# Patient Record
Sex: Male | Born: 1965 | Race: White | Hispanic: No | Marital: Single | Smoking: Current every day smoker
Health system: Southern US, Community
[De-identification: ages and names within clinical notes are randomized; demographics above are authoritative.]

## PROBLEM LIST (undated history)

## (undated) DIAGNOSIS — I10 Essential (primary) hypertension: Secondary | ICD-10-CM

## (undated) DIAGNOSIS — E785 Hyperlipidemia, unspecified: Secondary | ICD-10-CM

## (undated) DIAGNOSIS — L0291 Cutaneous abscess, unspecified: Secondary | ICD-10-CM

## (undated) DIAGNOSIS — M1990 Unspecified osteoarthritis, unspecified site: Secondary | ICD-10-CM

## (undated) HISTORY — PX: CHOLECYSTECTOMY: SHX55

## (undated) HISTORY — PX: APPENDECTOMY: SHX54

## (undated) HISTORY — PX: APPENDECTOMY (OPEN): SHX54

## (undated) HISTORY — PX: OTHER SURGICAL HISTORY: SHX169

## (undated) HISTORY — PX: ABDOMINAL SURGERY: SHX537

---

## 1992-02-29 ENCOUNTER — Emergency Department: Admit: 1992-02-29 | Disposition: A | Discharge status: Home / Self Care | Payer: Self-pay | Source: Ambulatory Visit

## 1998-11-12 ENCOUNTER — Emergency Department: Admit: 1998-11-12 | Disposition: A | Discharge status: Home / Self Care | Payer: Self-pay | Source: Ambulatory Visit

## 2002-05-17 ENCOUNTER — Emergency Department: Admit: 2002-05-17 | Payer: Self-pay | Source: Emergency Department | Admitting: Emergency Medicine

## 2004-10-22 ENCOUNTER — Emergency Department
Admission: EM | Admit: 2004-10-22 | Disposition: A | Discharge status: Home / Self Care | Payer: Self-pay | Source: Ambulatory Visit

## 2004-10-23 ENCOUNTER — Ambulatory Visit
Admission: AD | Admit: 2004-10-23 | Disposition: A | Discharge status: Home / Self Care | Payer: Self-pay | Source: Ambulatory Visit

## 2004-10-30 ENCOUNTER — Ambulatory Visit: Admit: 2004-10-30 | Disposition: A | Discharge status: Home / Self Care | Payer: Self-pay | Source: Ambulatory Visit

## 2004-12-10 ENCOUNTER — Ambulatory Visit
Admission: RE | Admit: 2004-12-10 | Disposition: A | Discharge status: Home / Self Care | Payer: Self-pay | Source: Ambulatory Visit

## 2005-03-16 ENCOUNTER — Observation Stay
Admission: EM | Admit: 2005-03-16 | Disposition: A | Discharge status: Home / Self Care | Payer: Self-pay | Source: Ambulatory Visit

## 2005-03-16 ENCOUNTER — Ambulatory Visit
Admission: AD | Admit: 2005-03-16 | Disposition: A | Discharge status: Home / Self Care | Payer: Self-pay | Source: Ambulatory Visit

## 2013-07-18 ENCOUNTER — Inpatient Hospital Stay
Admission: EM | Admit: 2013-07-18 | Discharge: 2013-07-20 | DRG: 287 | Disposition: A | Discharge status: Home / Self Care | Payer: Self-pay | Attending: Internal Medicine | Admitting: Internal Medicine

## 2013-07-18 ENCOUNTER — Inpatient Hospital Stay: Payer: Self-pay | Admitting: Internal Medicine

## 2013-07-18 ENCOUNTER — Emergency Department: Payer: Self-pay

## 2013-07-18 DIAGNOSIS — I2510 Atherosclerotic heart disease of native coronary artery without angina pectoris: Secondary | ICD-10-CM | POA: Diagnosis present

## 2013-07-18 DIAGNOSIS — R9439 Abnormal result of other cardiovascular function study: Secondary | ICD-10-CM | POA: Diagnosis present

## 2013-07-18 DIAGNOSIS — M7730 Calcaneal spur, unspecified foot: Secondary | ICD-10-CM | POA: Diagnosis present

## 2013-07-18 DIAGNOSIS — R0789 Other chest pain: Principal | ICD-10-CM | POA: Diagnosis present

## 2013-07-18 DIAGNOSIS — F411 Generalized anxiety disorder: Secondary | ICD-10-CM | POA: Diagnosis present

## 2013-07-18 DIAGNOSIS — Z8249 Family history of ischemic heart disease and other diseases of the circulatory system: Secondary | ICD-10-CM

## 2013-07-18 DIAGNOSIS — Z833 Family history of diabetes mellitus: Secondary | ICD-10-CM

## 2013-07-18 DIAGNOSIS — Z7982 Long term (current) use of aspirin: Secondary | ICD-10-CM

## 2013-07-18 DIAGNOSIS — E785 Hyperlipidemia, unspecified: Secondary | ICD-10-CM | POA: Diagnosis present

## 2013-07-18 DIAGNOSIS — M79672 Pain in left foot: Secondary | ICD-10-CM | POA: Diagnosis present

## 2013-07-18 DIAGNOSIS — M19079 Primary osteoarthritis, unspecified ankle and foot: Secondary | ICD-10-CM | POA: Diagnosis present

## 2013-07-18 DIAGNOSIS — F17200 Nicotine dependence, unspecified, uncomplicated: Secondary | ICD-10-CM | POA: Diagnosis present

## 2013-07-18 DIAGNOSIS — I10 Essential (primary) hypertension: Secondary | ICD-10-CM | POA: Diagnosis present

## 2013-07-18 DIAGNOSIS — R079 Chest pain, unspecified: Secondary | ICD-10-CM

## 2013-07-18 LAB — CBC AND DIFFERENTIAL
Basophils %: 0.8 % (ref 0.0–3.0)
Basophils Absolute: 0.1 10*3/uL (ref 0.0–0.3)
Eosinophils %: 2.5 % (ref 0.0–7.0)
Eosinophils Absolute: 0.2 10*3/uL (ref 0.0–0.8)
Hematocrit: 47.1 % (ref 39.0–52.5)
Hemoglobin: 16.5 gm/dL (ref 13.0–17.5)
Lymphocytes Absolute: 1.9 10*3/uL (ref 0.6–5.1)
Lymphocytes: 19.9 % (ref 15.0–46.0)
MCH: 33 pg (ref 28–35)
MCHC: 35 gm/dL (ref 32–36)
MCV: 94 fL (ref 80–100)
MPV: 8.3 fL (ref 6.0–10.0)
Monocytes Absolute: 0.9 10*3/uL (ref 0.1–1.7)
Monocytes: 9.1 % (ref 3.0–15.0)
Neutrophils %: 67.6 % (ref 42.0–78.0)
Neutrophils Absolute: 6.3 10*3/uL (ref 1.7–8.6)
PLT CT: 265 10*3/uL (ref 130–440)
RBC: 5 10*6/uL (ref 4.00–5.70)
RDW: 11.8 % (ref 11.0–14.0)
WBC: 9.4 10*3/uL (ref 4.0–11.0)

## 2013-07-18 LAB — ECG 12-LEAD
P Wave Axis: 46 deg
P Wave Axis: 32 deg
P Wave Duration: 102 ms
P Wave Duration: 103 ms
P-R Interval: 160 ms
P-R Interval: 164 ms
Patient Age: 47 years
Patient Age: 47 years
Q-T Dispersion: 16 ms
Q-T Interval(Corrected): 411 ms
Q-T Interval(Corrected): 427 ms
Q-T Interval: 338 ms
Q-T Interval: 377 ms
QRS Axis: 31 deg
QRS Axis: 32 deg
QRS Duration: 98 ms
QRS Duration: 80 ms
T Axis: 57 deg
T Axis: 49 deg
Ventricular Rate: 89 /min
Ventricular Rate: 77 /min

## 2013-07-18 LAB — I-STAT CHEM 8 CARTRIDGE
Anion Gap I-Stat: 19 — ABNORMAL HIGH (ref 7–16)
BUN I-Stat: 16 mg/dL (ref 6–20)
Calcium Ionized I-Stat: 4.7 mg/dL (ref 4.35–5.10)
Chloride I-Stat: 106 mMol/L (ref 98–112)
Creatinine I-Stat: 0.9 mg/dL (ref 0.90–1.30)
EGFR: >60 mL/min/{1.73_m2}
Glucose I-Stat: 117 mg/dL — ABNORMAL HIGH (ref 70–99)
Hematocrit I-Stat: 46 % (ref 39.0–52.5)
Hemoglobin I-Stat: 15.6 gm/dL (ref 13.0–17.5)
Potassium I-Stat: 4 mMol/L (ref 3.5–5.3)
Sodium I-Stat: 142 mMol/L (ref 135–145)
TCO2 I-Stat: 22 mMol/L — ABNORMAL LOW (ref 24–29)

## 2013-07-18 LAB — VH CARDIAC PROF.WITH TROPONIN
Creatine Kinase (CK): 235 U/L — ABNORMAL HIGH (ref 30–230)
Creatine Kinase (CK): 202 U/L (ref 30–230)
Creatinine Kinase MB (CKMB): 1.9 ng/mL (ref 0.1–6.0)
Creatinine Kinase MB (CKMB): 1.8 ng/mL (ref 0.1–6.0)
Troponin I: <0.01 ng/mL (ref 0.00–0.02)
Troponin I: 0 ng/mL (ref 0.00–0.02)

## 2013-07-18 LAB — LIPID PANEL
Cholesterol: 147 mg/dL (ref 75–199)
Coronary Heart Disease Risk: 3.59
HDL: 41 mg/dL (ref 40–55)
LDL Calculated: 81 mg/dL
Triglycerides: 124 mg/dL (ref 10–150)
VLDL: 25 (ref 0–40)

## 2013-07-18 LAB — VH CARDIAC PROFILE
Creatine Kinase (CK): 314 U/L — ABNORMAL HIGH (ref 30–230)
Creatinine Kinase MB (CKMB): 2.5 ng/mL (ref 0.1–6.0)

## 2013-07-18 LAB — I-STAT TROPONIN: Troponin I I-Stat: <0.02 ng/mL (ref 0.00–0.02)

## 2013-07-18 LAB — VH I-STAT CHEM 8 NOTIFICATION

## 2013-07-18 LAB — VH I-STAT TROPONIN NOTIFICATION

## 2013-07-18 MED ORDER — SODIUM CHLORIDE 0.9 % IJ SOLN
3.0000 mL | Freq: Three times a day (TID) | INTRAMUSCULAR | Status: DC
Start: 2013-07-18 — End: 2013-07-20
  Administered 2013-07-18 – 2013-07-20 (×6): 3 mL via INTRAVENOUS

## 2013-07-18 MED ORDER — NITROGLYCERIN 0.4 MG SL SUBL
0.4000 mg | SUBLINGUAL_TABLET | SUBLINGUAL | Status: AC
Start: 2013-07-18 — End: 2013-07-18
  Administered 2013-07-18 (×2): 0.4 mg via SUBLINGUAL

## 2013-07-18 MED ORDER — ACETAMINOPHEN 325 MG PO TABS
650.0000 mg | ORAL_TABLET | Freq: Four times a day (QID) | ORAL | Status: DC | PRN
Start: 2013-07-18 — End: 2013-07-20

## 2013-07-18 MED ORDER — ASPIRIN 81 MG PO CHEW
81.0000 mg | CHEWABLE_TABLET | Freq: Every day | ORAL | Status: DC
Start: 2013-07-18 — End: 2013-07-20
  Administered 2013-07-18 – 2013-07-20 (×3): 81 mg via ORAL
  Filled 2013-07-18 (×3): qty 1

## 2013-07-18 MED ORDER — ASPIRIN 81 MG PO CHEW
324.0000 mg | CHEWABLE_TABLET | Freq: Once | ORAL | Status: AC
Start: 2013-07-18 — End: 2013-07-18
  Administered 2013-07-18: 324 mg via ORAL

## 2013-07-18 MED ORDER — ENOXAPARIN SODIUM 40 MG/0.4ML SC SOLN
40.0000 mg | Freq: Every morning | SUBCUTANEOUS | Status: DC
Start: 2013-07-18 — End: 2013-07-19
  Administered 2013-07-18: 40 mg via SUBCUTANEOUS
  Filled 2013-07-18 (×2): qty 0.4

## 2013-07-18 MED ORDER — MORPHINE SULFATE 2 MG/ML IJ/IV SOLN (WRAP)
2.0000 mg | Status: DC | PRN
Start: 2013-07-18 — End: 2013-07-20

## 2013-07-18 MED ORDER — NITROGLYCERIN 0.4 MG SL SUBL
0.4000 mg | SUBLINGUAL_TABLET | SUBLINGUAL | Status: DC | PRN
Start: 2013-07-18 — End: 2013-07-20

## 2013-07-18 MED ORDER — NITROGLYCERIN 0.4 MG SL SUBL
0.4000 mg | SUBLINGUAL_TABLET | SUBLINGUAL | Status: DC | PRN
Start: 2013-07-18 — End: 2013-07-18

## 2013-07-18 MED ORDER — NITROGLYCERIN 2 % TD OINT
0.5000 [in_us] | TOPICAL_OINTMENT | TRANSDERMAL | Status: DC
Start: 2013-07-18 — End: 2013-07-20
  Administered 2013-07-18 – 2013-07-20 (×5): 0.5 [in_us] via TOPICAL
  Filled 2013-07-18 (×12): qty 1

## 2013-07-18 MED ORDER — NITROGLYCERIN 0.4 MG SL SUBL
SUBLINGUAL_TABLET | SUBLINGUAL | Status: AC
Start: 2013-07-18 — End: ?
  Filled 2013-07-18: qty 25

## 2013-07-18 MED ORDER — INFLUENZA VIRUS VACC SPLIT PF IM SUSP
0.5000 mL | INTRAMUSCULAR | Status: DC | PRN
Start: 2013-07-18 — End: 2013-07-20

## 2013-07-18 MED ORDER — ASPIRIN 81 MG PO CHEW
CHEWABLE_TABLET | ORAL | Status: AC
Start: 2013-07-18 — End: ?
  Filled 2013-07-18: qty 4

## 2013-07-18 MED ORDER — OXYCODONE-ACETAMINOPHEN 5-325 MG PO TABS
1.0000 | ORAL_TABLET | Freq: Four times a day (QID) | ORAL | Status: DC | PRN
Start: 2013-07-18 — End: 2013-07-19
  Administered 2013-07-18 – 2013-07-19 (×4): 1 via ORAL
  Filled 2013-07-18 (×4): qty 1

## 2013-07-18 MED ORDER — PANTOPRAZOLE SODIUM 40 MG PO TBEC
40.0000 mg | DELAYED_RELEASE_TABLET | Freq: Every morning | ORAL | Status: DC
Start: 2013-07-18 — End: 2013-07-20
  Administered 2013-07-18 – 2013-07-20 (×3): 40 mg via ORAL
  Filled 2013-07-18 (×4): qty 1

## 2013-07-18 MED ORDER — IBUPROFEN 400 MG PO TABS
400.0000 mg | ORAL_TABLET | Freq: Four times a day (QID) | ORAL | Status: DC
Start: 2013-07-18 — End: 2013-07-19
  Administered 2013-07-18 – 2013-07-19 (×4): 400 mg via ORAL
  Filled 2013-07-18 (×8): qty 1

## 2013-07-18 MED ORDER — PRAVASTATIN SODIUM 40 MG PO TABS
40.0000 mg | ORAL_TABLET | Freq: Every evening | ORAL | Status: DC
Start: 2013-07-18 — End: 2013-07-20
  Administered 2013-07-18 – 2013-07-19 (×3): 40 mg via ORAL
  Filled 2013-07-18 (×5): qty 1

## 2013-07-18 NOTE — UM Notes (Addendum)
[facility] Utilization Management Review Sheet    NAME: Gary Vazquez.  MR#: [id number]    CSN#: [id number]    ROOM: 379/379-A AGE: 47 y.o.    ADMIT DATE AND TIME: 07/18/2013  1:53 AM MD Order 07/18/2013 @ 0439    PATIENT CLASS:Observation    ATTENDING PHYSICIAN: Rodman Pickle, MD  PAYOR:Payor:        Berkley Harvey #:     DIAGNOSIS: Principal Problem:   *Chest pain  Active Problems:   Tobacco dependence   Heel pain, left        HISTORY: History reviewed. No pertinent past medical history.    DATE OF REVIEW: 07/18/2013    VITALS: BP 129/71  Pulse 70  Temp 97.6 F (36.4 C) (Oral)  Resp 16  Ht 1.89 m (6' 2.4")  Wt 95.7 kg (210 lb 15.7 oz)  BMI 26.79 kg/m2  SpO2 97%    ED Treatment    Presentation:  47 y.o. male who presents to the ED with complaint of 8/10 chest pain that began 2230, he reports he was laying in bed. It started as burning in his stomach, he then drank tea and that seemed to make it stomach swelling. The pain then went into his chest "felt as if someone punched me in the chest." Associated symptoms include nausea and SOB, patient report finger went numb. Patient reports no chest pain with exertion. History of acid reflux Blood pressure 136/63, pulse 67, temperature 98.6 F (37 C), resp. rate 12, height 1.88 m, weight 97.7 kg, SpO2 96.00%.  EKG shows a sinus rhythm at a rate of 89. Normal intervals, normal axis, no acute ST-T wave changes are noted. He does have an RS, R. Prime in V1. This is interpreted by myself, care, and    Labs:    Meds:  ER. He received two tablets of 0.4 mg sublingual nitroglycerin and aspirin 324 mg   Medical Admission Review  Medical  H&P:  Chest pain. medical history significant for tobacco dependence, who came into [facility] with the   above chief complaint. The patient states that he developed sudden onset retrosternal chest pain, 8/10 in intensity at rest, pressure like, radiating to his left arm which persisted till he came to the ER. He received two tablets  of 0.4 mg sublingual nitroglycerin and aspirin 324 mg and is currently chest pain free. He also had associated symptoms of nausea, shortness of breath, and numbness in his left upper extremity Blood pressure 136/63, pulse rate 67, respiratory rate 14, temperature 98.6 degrees Fahrenheit taken orally and saturating 99% on room air.  admitted with chest pain this morning Has bilateral hands and wrist pain which is new Joint swelling. Concern for arthritis pericarditis? Check ECHO Start motrin Stress in am.    Labs:  12-lead EKG normal sinus rhythm, ventricular rate of 89 beats per minute. No ischemic changes noted. Portable chest x-ray no acute airspace disease. X-ray of the left foot and two views  no fracture, no dislocation seen. I-STAT troponin is less than 0.02. Creatine kinase 314, CK-MB 2.5, I-STAT electrolytes, glucose 117. Sodium 142, potassium 4,   chloride 106, bicarbonate 22, BUN of 16, creatinine of 0.9. WBC 9.4, hemoglobin 16.5, hematocrit 47.1 and platelet count 265.    Assessment/ Plan:  1. Chest pain, patient has significant risk factors of male, sex, age, and tobacco use and family history. Initial   cardiac workup negative. Currently chest pain free. We will admit to cardiac telemetry. Trend  cardiac enzymes. Repeat   12-lead EKG in the morning. Antiplatelet, antianginal, anti coagulant therapy and statins ordered. We will schedule for   a nuclear stress test on Monday 22nd of December 2014. We will also order a fasting lipid panel.   2. Left heel pain likely musculoskeletal, no evidence of fracture on x-ray of the foot, pain management.   3. Tobacco dependence via cigarette smoking. The patient counseled on cessation for 3 to 5 minutes. No nicotine   replacement therapy because of ongoing chest pain.    Trop 0.01, 0.00    CSR:  Cardiology 12/22  Assessment:  IMP/PLAN   1. CP/Abnormal stress: Possible multivessel ischemia. Cath today   2. Lipids: good levels on diet. Agree with statin if CAD  confirmed with a goal of an LDL 70mg %   3. Tobacco abuse: Counseled to quit    Labs:    Antonietta Jewel R.N. [facility]  Phone [phone], Fax [phone]

## 2013-07-18 NOTE — ED Provider Notes (Signed)
Physician/Midlevel provider first contact with patient: 07/18/13 0254           EMERGENCY DEPARTMENT  History and Physical Exam       Patient Name: Gary Vazquez,[name]  Encounter Date:  07/18/2013  Attending Physician: Tori Milks, M.D.  PCP: Christa See, MD  Patient DOB:  [birth year]  MRN:  [id number]  Room:  C17/C17-A    Medical Decision Making     Concerning for acute cardiac syndrome. Consider acute MI. Low suspicion for PE or AAA.  Consider GERD. I will also x-ray his left foot, which is dramatic and not related.    Labs are unremarkable. Chest x-ray shows no acute disease. This is read by myself and will be over read and have a final reading in the morning by radiology.  I see no bony abnormalities in his left foot either    Workup here is negative. He did get better with nitroglycerin. Says the discomfort in his chest is essentially gone. He is to be admitted for further risk stratification.    In addition to the above history, please see nursing notes. Allergies, meds, past medical, family, social hx, and the results of the diagnostic studies performed have been reviewed by myself.         Diagnosis / Disposition     Clinical Impression  1. Chest pain        Disposition  ED Disposition     Admit Bed Type: Telemetry [5]  Admitting Physician: Ester Rink [[id number]]  Patient Class: Inpatient [101]              Follow up for Discharged Patients  No follow-up provider specified.    Prescriptions for Discharged Patients  New Prescriptions    No medications on file                   History of Presenting Illness     Chief complaint: Chest Pain    HPI/ROS is limited by: none  HPI/ROS given by: patient    Gary Vazquez. is a 47 y.o. male who presents to the ED with complaint of 8/10 chest pain that began 2230, he reports he was laying in bed. It started as burning in his stomach, he then drank tea and that seemed to make it stomach swelling. The pain then went into his chest "felt as if someone  punched me in the chest." Associated symptoms include nausea and SOB, patient report finger went numb. Patient reports no chest pain with exertion. History of acid reflux. Reports chest pain is a 6/10 on exam.   In addition patient complains of left heel pain "it is like it is not attached." Reports when walking "the meat goes to the side."        Review of Systems   Constitutional: No fever.  No chills.    GI: + nausea.  No vomiting.  No diarrhea. No bright red blood/melena per rectum. No abdominal pain.     Ears:  No ear pain.    Nose:  No congestion.  No discharge    Throat:  No sore throat.  No difficulty swallowing.    Cardiovascular: + chest pain.  No palpitations. + diaphoresis.     Respiratory: No cough.  + shortness of breath.    GU:  No dysuria. No hematuria.     Neurological:  No headache.  No weakness. + numbness.     Musculoskeletal:  No pain.  Skin:  No rash.  No skin lesions.    Endocrine:  No weight change    Psychiatric:  No depression.  No anxiety.      All other systems reviewed and negative except as above, pertinent findings in HPI.     Allergies & Medications     Pt  has no known allergies.    Current/Home Medications    No medications on file        Past Medical History     Pt has no past medical history on file.     Past Surgical History     Pt  has no past surgical history on file.     Family History     The family history is not on file.     Social History     Pt does not have a smoking history on file. He does not have any smokeless tobacco history on file. His alcohol and drug histories not on file.     Physical Exam     Blood pressure 136/63, pulse 67, temperature 98.6 F (37 C), resp. rate 12, height 1.88 m, weight 97.7 kg, SpO2 96.00%.    Vitals signs reviewed.    Constitutional:  Well-nourished. No acute distress.    Head:  Atraumatic, normocephalic    Eyes:  Pupils equal, and round.  Conjunctiva clear. No injection.    Nose:   No discharge.     Throat:  Oropharynx moist.  No  erythema.  No exudates     Neck:  Supple, non tender.  No cervical lymphadenopathy. No JVD.    Respiratory:  Breath sounds normal.  No distress. No wheezes, rales or rhonchi.     Cardiovascular:  Heart normal rate and regular rhythm.  No murmurs/gallops/rubs.  Pulses +2 bilaterally. Chest non-tender.     Abdomen:  Soft. Non-tender.  Normal bowel sounds. No distension. No bruits. No guarding. No rebound.     Back:  No CVA tenderness bilaterally.    Extremities:  Full range of motion.  No edema.  No cyanosis.  No deformity. Left heel mildly tender.     Skin:  Warm.  Dry.  No pallor.  No rashes.  No lesions.  No bruises    Neurological:  Alert. Oriented to person,place, time.  GCS 15.  No focal motor deficits.   Cranial Nerves II-XII Grossly intact.    Psychiatric:  Normal affect.  No anxiety.  No depression.  No agitation.       Diagnostic Results     The results of the diagnostic studies have been reviewed by myself:    Radiologic Studies  No results found.         Consultation                Procedures / EKG     EKG shows a sinus rhythm at a rate of 89. Normal intervals, normal axis, no acute ST-T wave changes are noted. He does have an RS, R. Prime in V1. This is interpreted by myself, care, and          Rosalyn Gess. Grace Isaac, M.D.      ATTESTATIONS      The documentation recorded by my scribe, Renaldo Harrison, accurately reflects the services I personally performed and the decisions made by me.  Rosalyn Gess Grace Isaac, M.D.           Tori Milks, MD  07/18/13 [phone]

## 2013-07-18 NOTE — Plan of Care (Signed)
Problem: Pain  Goal: Patient's pain/discomfort is manageable  Outcome: Progressing  Patient asked to report any Chest Pain and to try and stay off the left ankle which is causing him pain.

## 2013-07-18 NOTE — Progress Notes (Signed)
Pt calm and comfortable with no complaints at this time. VSS. Shift uneventful. INT intact, tele intact. Call bell in reach. Report passed to oncoming RN.

## 2013-07-18 NOTE — H&P (Signed)
07/18/13    PRIMARY CARE PROVIDER: None.    CHIEF COMPLAINT: Chest pain.    HISTORY OF PRESENTING ILLNESS: The patient is a 47 year old  Caucasian male with medical history significant for tobacco  dependence, who came into [facility] with the  above chief complaint.  The patient states that he developed  sudden onset retrosternal chest pain, 8/10 in intensity at rest,  pressure like, radiating to his left arm which persisted till he  came to the ER.  He received two tablets of 0.4 mg sublingual  nitroglycerin and aspirin 324 mg and is currently chest pain  free.  He also had associated symptoms of nausea, shortness of  breath, and numbness in his left upper extremity.  The patient  has never had any chest pain and has not had any cardiac workup  in the past.  There is a family history of coronary artery  disease on his father's side as well he complains of left heel  pain.  He reports trauma to the heel when he hit the heel of his  left foot against a piece of wood and has throbbing pain 9/10 in  intensity and in turn he is not able to bear any weight on it.  At the time, patient was seen by dictating physician.  He did  not have any chest pain, however, he had received aspirin and  nitroglycerin x2.  Prior to that, he still had left heel pain.  Imaging studies of the heel did not show any fractures.  Ischemic workup for cardiac etiology of chest pain currently  negative.  [facility] as consulted for admission  and upon review, the patient to be admitted under our service  and scheduled for a nuclear stress test within the next 24  hours.    PAST MEDICAL HISTORY: As stated in history of presenting  illness.    PAST SURGICAL HISTORY: Appendectomy.    ALLERGIES: No known drug allergies.    CURRENT OUTPATIENT MEDICATIONS: Reviewed and none.    ER administered medications,    1.  Aspirin 324 mg orally x1.  2.  Nitroglycerin 0.4 mg sublingual x2.    FAMILY HISTORY: Positive for coronary  artery disease in his  father and paternal grandmother.    SOCIAL HISTORY: The patient smokes one pack of cigarettes per  day and drinks alcohol on a social basis.  Does not use illicit  drugs.  He is a FULL CODE.  Designated healthcare proxy is his  father Auguste Tebbetts Sr. and patient does not have an  advance directive to that effect.    REVIEW OF SYSTEMS: Except as stated in the history of presenting  illness, 10 point system reviewed and negative.    PHYSICAL EXAMINATION: VITAL SIGNS:  Blood pressure 136/63, pulse  rate 67, respiratory rate 14, temperature 98.6 degrees  Fahrenheit taken orally and saturating 99% on room air.  GENERAL:  Middle-aged Caucasian male who looks older than stated  age, not in acute distress.  HEENT:  Head is normocephalic, atraumatic.  Eyes, pink  conjunctivae, anicteric sclerae.  ENT, moist mucous membranes.  NECK:  Supple.  Full range of motion.  No jugular venous  distention.  CARDIOVASCULAR:  S1 and S2 of normal intensity.  Regular rhythm.  No murmurs, rubs, or gallops appreciated.  No tenderness to  palpation of chest wall.  RESPIRATORY:  Lungs are clear to auscultation bilaterally  without any adventitious sounds heard.  ABDOMEN:  Soft without  any tenderness, rebound tenderness or  guarding in any of the quadrants.  No costovertebral angle  tenderness noted.  No organomegaly appreciated.  EXTREMITIES:  2+ pulses.  No cyanosis, clubbing, or edema.  MUSCULOSKELETAL:  The patient has tenderness to palpation of the  left heel without any evidence of erythema.  No swelling noted.  No joint deformities noted in the left foot.  Rest of the joints  examination within normal limits without any deformities,  tenderness or swelling.  SKIN:  Skin is warm and dry to touch.  No lesions seen.  LYMPHATICS:  No lymphadenopathy noted.  NEUROLOGIC:  The patient is awake, alert and oriented to time,  place, person, situation.  No gross focal neurological deficit  noted.  ABDOMEN:  Abdomen is  not distended.  Normoactive bowel sounds  present.  On palpation is soft without any tenderness, rebound  tenderness, or guarding any of the quadrants.  No costovertebral  angle tenderness noted.  No organomegaly appreciated.    LABORATORY AND DIAGNOSTIC REVIEW: A 12-lead EKG reviewed by me,  normal sinus rhythm, ventricular rate of 89 beats per minute.  No ischemic changes noted.  Portable chest x-ray reviewed by me,  no acute airspace disease.  Follow up with official report.  X-ray of the left foot and two views reviewed by me, no  fracture, no dislocation seen.  Follow up with official report.  I-STAT troponin is less than 0.02.  Creatine kinase 314, CK-MB  2.5, I-STAT electrolytes, glucose 117.  Sodium 142, potassium 4,  chloride 106, bicarbonate 22, BUN of 16, creatinine of 0.9.  WBC  9.4, hemoglobin 16.5, hematocrit 47.1 and platelet count 265.    ASSESSMENT AND PLAN: A 47 year old Caucasian male with no  underlying significant medical history, presents with chest  pain.  Initial cardiac workup negative.  Admitted under  Hospitalist Service Group to be managed as follows:    1.  Chest pain; patient has significant risk factors of      Male sex, age, tobacco use and family history.  Initial      cardiac workup negative.  Currently chest pain free.  Will      admit to cardiac telemetry.  Trend cardiac enzymes.  Repeat      12-lead EKG in the morning.  Antiplatelet, antianginal, anti      coagulant therapy and statins ordered.  We will schedule for      a nuclear stress test on Monday 22nd of December 2014.  Will      also order a fasting lipid panel.  2.  Left heel pain likely musculoskeletal, no evidence of      fracture on x-ray of the foot, pain management.  3.  Tobacco dependence via cigarette smoking.  The patient      counseled on cessation for 3 to 5 minutes.  No nicotine      replacement therapy because of ongoing chest pain.  4.  Deep venous thrombosis prophylaxis with Lovenox.  5.  Peptic ulcer  disease prophylaxis Protonix.  6.  Plan of care discussed with patient and is in      agreement as outlined.  7.  The patient is a FULL CODE.  8.  Patient seen at 0430 hours on 07/18/2013.        I hereby certify this patient for hospitalization based upon   medical necessity as noted above.    [id number]  DD: 07/18/2013 05:04:40  DT: 07/18/2013 08:55:57  JOB: [id number]

## 2013-07-18 NOTE — Progress Notes (Signed)
Nursing Progress Note:  0600    Gary Vazquez.    Primary Problem: Chest pain    Length of Stay: 0    I assumed care of this patient at 0530AM    Patients telemetry monitor was intact. Telemetry was reviewed and strip is in the chart.        Assessment:  Neuro: WDL  HEENT: WDL  Resp: Diminished   CVS: normal rate, regular rhythm, normal S1, S2, no murmurs, rubs, clicks or gallops. No pain or SOB  GI/GU: WDL  Skin: intact     Left Ankle Pain, no swelling or deformity noted. Patient reports this pain at 5/10.     Patient concerns/comments:  Ankle Pain    Plan:  Stress Test Monday      All recent labs and notes have been reviewed for this patient. I will continue to monitor.    Gary Vazquez 07/18/2013 7:24 AM

## 2013-07-18 NOTE — Progress Notes (Signed)
12/21

## 2013-07-18 NOTE — Progress Notes (Signed)
Initial assessment complete. Pt A&Ox3. No c/o chest pain but does c/o L heel pain. Heel slightly swollen, no redness noted. INT intact, tele intact. Call bell in reach. Pt aware of poc for Stress test tomorrow. Will continue to monitor.

## 2013-07-18 NOTE — Progress Notes (Signed)
Nursing Progress Note:  11:00 PM    Gary Vazquez.    Primary Problem: Chest pain    Length of Stay: 0    I assumed care of this patient at 7:15PM    Patients telemetry monitor was intact. Telemetry was reviewed and strip is in the chart.        Assessment:  Neuro: WDL  HEENT: WDL  Resp: Diminished in the bases  CVS: normal rate, regular rhythm, normal S1, S2, no murmurs, rubs, clicks or gallops.  GI/GU: WDL  Skin: Intact    Patient concerns/comments:  Heel pain    Plan:  Stress Test tomorrow       All recent labs and notes have been reviewed for this patient. I will continue to monitor.    Gary Vazquez 07/18/2013 11:00 PM

## 2013-07-18 NOTE — ED Notes (Signed)
Dr. Kharsa at bedside.

## 2013-07-18 NOTE — Progress Notes (Signed)
47 yo male admitted with chest pain this morning  Has bilateral hands and wrist pain which is new  Joint swelling. Concern for arthritis pericarditis? Check ECHO  Start motrin  Stress in am.  Gi prophylaxis: continue with PPI  Minard Millirons. MD

## 2013-07-18 NOTE — ED Notes (Signed)
Pt stating no further c/p at this time.  Pt also stated he did chop wood and hung dry wall yesterday.  Pt continues sinus rhythm hr 70.   Call bell with pt.

## 2013-07-19 ENCOUNTER — Observation Stay: Payer: Self-pay

## 2013-07-19 ENCOUNTER — Encounter
Admission: EM | Disposition: A | Discharge status: Home / Self Care | Payer: Self-pay | Source: Home / Self Care | Attending: Internal Medicine

## 2013-07-19 SURGERY — RIGHT & LEFT HEART CATH  W/ CORONARY ANGIOS, LV/LA
Anesthesia: Conscious Sedation | Laterality: Right

## 2013-07-19 MED ORDER — ASPIRIN 81 MG PO CHEW
243.0000 mg | CHEWABLE_TABLET | Freq: Once | ORAL | Status: AC
Start: 2013-07-19 — End: 2013-07-19
  Administered 2013-07-19: 243 mg via ORAL
  Filled 2013-07-19: qty 3

## 2013-07-19 MED ORDER — SODIUM CHLORIDE 0.9 % IV SOLN
INTRAVENOUS | Status: DC
Start: 2013-07-19 — End: 2013-07-20

## 2013-07-19 MED ORDER — VERAPAMIL HCL 2.5 MG/ML IV SOLN
INTRAVENOUS | Status: AC
Start: 2013-07-19 — End: 2013-07-19
  Administered 2013-07-19: 2.5 mg via INTRA_ARTERIAL
  Filled 2013-07-19: qty 2

## 2013-07-19 MED ORDER — REGADENOSON 0.4 MG/5ML IV SOLN
0.4000 mg | Freq: Once | INTRAVENOUS | Status: AC
Start: 2013-07-19 — End: 2013-07-19
  Administered 2013-07-19: 0.4 mg via INTRAVENOUS
  Filled 2013-07-19: qty 5

## 2013-07-19 MED ORDER — MIDAZOLAM HCL 2 MG/2ML IJ SOLN
INTRAMUSCULAR | Status: AC
Start: 2013-07-19 — End: 2013-07-19
  Administered 2013-07-19: 2 mg via INTRAVENOUS
  Filled 2013-07-19: qty 2

## 2013-07-19 MED ORDER — FENTANYL CITRATE 0.05 MG/ML IJ SOLN
INTRAMUSCULAR | Status: AC
Start: 2013-07-19 — End: 2013-07-19
  Administered 2013-07-19: 50 ug via INTRAVENOUS
  Administered 2013-07-19: 25 ug via INTRAVENOUS
  Filled 2013-07-19: qty 2

## 2013-07-19 MED ORDER — LIDOCAINE HCL 1 % IJ SOLN
INTRAMUSCULAR | Status: AC
Start: 2013-07-19 — End: 2013-07-19
  Filled 2013-07-19: qty 20

## 2013-07-19 MED ORDER — VERAPAMIL HCL 2.5 MG/ML IV SOLN
INTRAVENOUS | Status: DC
Start: 2013-07-19 — End: 2013-07-19
  Filled 2013-07-19: qty 2

## 2013-07-19 MED ORDER — ASPIRIN 81 MG PO CHEW
243.0000 mg | CHEWABLE_TABLET | Freq: Once | ORAL | Status: DC
Start: 2013-07-19 — End: 2013-07-19

## 2013-07-19 MED ORDER — IODIXANOL 320 MG/ML IV SOLN
86.0000 mL | Freq: Once | INTRAVENOUS | Status: DC | PRN
Start: 2013-07-19 — End: 2013-07-20

## 2013-07-19 MED ORDER — TECHNETIUM TC 99M SESTAMIBI - CARDIOLITE
45.0000 | Freq: Once | Status: AC | PRN
Start: 2013-07-19 — End: 2013-07-19
  Administered 2013-07-19: 45.9 via INTRAVENOUS

## 2013-07-19 MED ORDER — HEPARIN (PORCINE) IN NACL 2-0.9 UNIT/ML-% IJ SOLN
INTRAMUSCULAR | Status: AC
Start: 2013-07-19 — End: 2013-07-19
  Filled 2013-07-19: qty 500

## 2013-07-19 MED ORDER — SODIUM CHLORIDE 0.9 % IV SOLN
INTRAVENOUS | Status: AC
Start: 2013-07-19 — End: 2013-07-19

## 2013-07-19 MED ORDER — ALPRAZOLAM 0.5 MG PO TABS
0.5000 mg | ORAL_TABLET | Freq: Once | ORAL | Status: AC
Start: 2013-07-19 — End: 2013-07-19
  Administered 2013-07-19: 0.5 mg via ORAL
  Filled 2013-07-19: qty 1

## 2013-07-19 MED ORDER — REGADENOSON 0.4 MG/5ML IV SOLN
INTRAVENOUS | Status: AC
Start: 2013-07-19 — End: ?
  Filled 2013-07-19: qty 5

## 2013-07-19 MED ORDER — HEPARIN SODIUM (PORCINE) 1000 UNIT/ML IJ SOLN
INTRAMUSCULAR | Status: AC
Start: 2013-07-19 — End: 2013-07-19
  Administered 2013-07-19: 3000 [IU] via INTRAVENOUS
  Filled 2013-07-19: qty 20

## 2013-07-19 MED ORDER — MIDAZOLAM HCL 2 MG/2ML IJ SOLN
INTRAMUSCULAR | Status: AC
Start: 2013-07-19 — End: 2013-07-19
  Administered 2013-07-19: 1 mg via INTRAVENOUS
  Filled 2013-07-19: qty 2

## 2013-07-19 MED ORDER — TECHNETIUM TC 99M SESTAMIBI - CARDIOLITE
15.0000 | Freq: Once | Status: AC | PRN
Start: 2013-07-19 — End: 2013-07-19
  Administered 2013-07-19: 15.6 via INTRAVENOUS

## 2013-07-19 MED ORDER — OXYCODONE-ACETAMINOPHEN 5-325 MG PO TABS
2.0000 | ORAL_TABLET | Freq: Four times a day (QID) | ORAL | Status: DC | PRN
Start: 2013-07-19 — End: 2013-07-20
  Administered 2013-07-19 – 2013-07-20 (×4): 2 via ORAL
  Filled 2013-07-19 (×4): qty 2

## 2013-07-19 NOTE — Progress Notes (Signed)
Lexiscan 0.4mg  IV injected over 15 seconds in Nuclear Cardiology for Stress Test ([facility]). 7/10 chest pain with injection; resolved in recovery. No ST changes. NAD. Final images pending.  Gary Vazquez Gary Vazquez  07/19/2013  8:35 AM

## 2013-07-19 NOTE — Progress Notes (Signed)
[facility]  PROGRESS NOTE      Patient: Gary Vazquez.  Date: 07/19/2013   LOS: 1 Days  Admission Date: 07/18/2013   MRN: [id number]  Attending: Rodman Pickle, MD       SUBJECTIVE   47 yo male came with chest pain.      MEDICATIONS     Current Facility-Administered Medications   Medication Dose Route Frequency   . [COMPLETED] ALPRAZolam  0.5 mg Oral Once   . [COMPLETED] aspirin  243 mg Oral Once   . aspirin  81 mg Oral Daily   . ibuprofen  400 mg Oral Q6H   . nitroglycerin  0.5 inch Topical Q6H HOLD MN   . pantoprazole  40 mg Oral QAM AC   . pravastatin  40 mg Oral QHS   . [COMPLETED] regadenoson  0.4 mg Intravenous Once   . sodium chloride (PF)  3 mL Intravenous Q8H   . [DISCONTINUED] aspirin  243 mg Oral Once   . [DISCONTINUED] enoxaparin  40 mg Subcutaneous QAM          . sodium chloride 100 mL/hr at 07/19/13 1230           PHYSICAL EXAM     Objective:  I/O last 3 completed shifts:  In: 1320 [P.O.:1320]  Out: 1645 [Urine:1645]     Filed Vitals:    07/18/13 1903 07/18/13 2330 07/19/13 0442 07/19/13 1045   BP: 125/77 130/87 117/76 135/82   Pulse: 61 57 90 55   Temp: 97.7 F (36.5 C) 97.9 F (36.6 C) 98.4 F (36.9 C) 97.6 F (36.4 C)   TempSrc: Oral Oral Oral Oral   Resp: 17 16 16 16    Height:       Weight:   96.3 kg (212 lb 4.9 oz)    SpO2: 99% 98% 98% 100%       Exam: Patient is awake, alert and oriented x 3, in no acute distress  HEENT: PERRLA, EOMI, Oral cavity well hydrated.  Neck: supple, without JVD  Chest: CTA bilaterally. No crackles or wheezing.  CVS: S1, S2 normal, no rubs, no murmurs, no gallops.  Regular rate and rhythm.  Abdomen: soft and non tender with good bowel sounds.  Musculoskeletal: No pitting edema, no cyanosis/clubbing  SKin: multiple tattoes  NEURO:  No Focal neurological deficits      LABS       Lab 07/18/13 0125   WBC 9.4   RBC 5.00   HGB 16.5   HCT 47.1   MCV 94   PLT 265     No results found for this basename: NA:5,K:5,CL:5,CO2:5,,BUN:5,CREAT:5,GLU:5,CA:5,MG:5 in the last  168 hours      No results found for this basename: OSMOL:3,OSMOLUR:3,NAUR:3 in the last 168 hours  No results found for this basename: ALT:3,AST:3,GGT:3,BILITOTAL:3,BILIDIRECT:3,ALB:3,ALKPHOS:3 in the last 168 hours    Lab 07/18/13 0856 07/18/13 0528 07/18/13 0125   CK 202 235* 314*   CKMB 1.8 1.9 2.5   CKMBINDEX NI NI NI   TROPI 0.00 <0.01 --     No results found for this basename: BNP      No results found for this basename: INR:3,PT:3,PTT:3 in the last 168 hours     No components found with this basename: BLOODCULTURE      RADIOLOGY     Radiological Procedure reviewed.  XR CHEST AP PORTABLE    Final Result: No focal infiltrate. Normal heart size. No change from 03/16/2005.  ReadingStation:[id number]   XR FOOT LEFT AP LATERAL AND OBLIQUE    Final Result: No observed fracture or dislocation left foot. Plantar calcaneal spurs on 10/22/2004         ReadingStation:[id number]   NM MYOCARDIAL PERFUSION SPECT (STRESS AND REST)    (Results Pending)   ECHO DOPPLER WAVEFORM COMPLETE    (Results Pending)   CV CARDIAC STRESS TEST TRACING ONLY    (Results Pending)   RIGHT & LEFT HEART CATH  W/ CORONARY ANGIOS, LV/LA    (Results Pending)       Assessment and Plan:   Chest pain/abnormal stress test:  Cardiac enzymes negative  Cardiology consulted  Will go for cardiac catheterization,     Mild hyperlipidemia: diet modification.    Tobacco abuse: The patient will be counseled to   discontinue tobacco use.    Arthritis/heel spurs  Joint swelling is decreasing    dvt px: heparin    Gi prophylaxis: PPI.  Rodman Pickle, MD  07/19/2013 2:57 PM    I can be reached at [phone]

## 2013-07-19 NOTE — Plan of Care (Signed)
Problem: Chest Pain  Goal: Pain at/below Pt Goal: Patient comfortable  Outcome: Progressing  Patient denies CP at this time. Will continue to monitor.

## 2013-07-19 NOTE — Progress Notes (Signed)
End of Shift Nursing Note:  6:44 AM    Gary Vazquez.    Chest pain    Summary of Day:  Patient rested well. Heel pain continued. No SOB    Changes:  None  Went for Stress at 0640.    Report was given to oncoming nurse. Labs were reviewed. Patient was introduced to oncoming nurse in a bedside handoff.     Patient Vitals for the past 12 hrs:   BP Temp Pulse Resp   07/19/13 0442 117/76 mmHg 98.4 F (36.9 C) 90  16    07/18/13 2330 130/87 mmHg 97.9 F (36.6 C) 57  16    07/18/13 1903 125/77 mmHg 97.7 F (36.5 C) 61  17        Care plans and education have been completed and updated.    Gary Vazquez 07/19/2013

## 2013-07-19 NOTE — Consults (Signed)
(  See Consult)    IMP/PLAN    1. CP/Abnormal stress: Possible multivessel ischemia. Cath today  2. Lipids: good levels on diet. Agree with statin if CAD confirmed with a goal of an LDL 70mg %  3. Tobacco abuse: Counseled to quit

## 2013-07-19 NOTE — Progress Notes (Signed)
Right wrist TR band removed without difficulty.  Area soft with no bleeding and no oozing. Light dressing applied with armboard left in place for next 2-3 hrs.  Restricted armband applied.  Pt tol well with no complaints.  Call bell in reach. Instructed to call for any bleediing, arm pain or numbness.

## 2013-07-19 NOTE — Progress Notes (Signed)
Report received from day nurse. Assumed care of pt. Pt is resting in bed at this time. IV INT. Tele intact. Call bell in reach. Pt denies needs at this time. Will continue to monitor. Cath site stable.
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
## 2013-07-19 NOTE — Procedures (Signed)
[facility]    Diagnostic Cath Report    Patient Name: Gary Vazquez.  Hospital Number: [id number]  CSN:   [id number]  Date of Birth:  11/30/1965  Date of Service: 07/19/2013     Performing Provider:  Langley Adie, MD  Requesting Provider: Judeth Cornfield, MD    Procedure performed: Left Heart Cath and Coronary Angiogram    Status:  Urgent    Indication: Unstable angina    Stress Test Results:  Intermediate Risk    Antianginals (Beta blockers, Calcium channel blockers, Long acting nitrates, Ranexa):   None      Access:  Right Radial     Closure :  TR band    Anticoagulants:  Heparin    Antiplatelets:  Aspirin    Sedation:  Conscious Sedation    See cath lab scanned report for equipment list, additional medications used, contrast volume, and fluoroscopy time.      FINDINGS:    Coronary Angiogram:    Left Main: short vessel segment, free of disease  LAD: Large wrap-around vessel, perfuses anterior wall and inferior apex. Mild to moderate tortuosity, minor plaque. No obstructive disease.  D1:  Large, bifurcating vessel, free of disease  D2: Moderate to large-sized vessel, free of disease  LCX: Codominant. Gives rise to two small to medium-sized marginals, 3rd OM is quite large, free of disease. Continues on to give rise to three distal PLB's all with minor luminal irregularities, free of obstructive lesions.  RCA: Codominant; minor luminal irregularities, distal tortuosity. No obstructive disease.  PDA: Small to mid-sized, free of lesions  Dominance:  Right    Left heart catheterization:   Ejection Fraction: 65%  Wall motion:  normal  Mitral Regurgitation: none  Pullback gradient: none  LV EDP: 17-19 mmHg    Complications:  None    Impression:    1. Minor nonobstructive coronary atherosclerosis  2. Preserved LVEF; elevated diastolic pressures suggest diastolic compliance abnormality    Recommendations:   1. Vigorous secondary prevention with diet, weight management, exercise and optimal management of  blood pressure and lipids.      Langley Adie, MD [facility], [facility]  Pager [phone]; [phone]    4:12 PM 07/19/2013

## 2013-07-19 NOTE — Progress Notes (Signed)
Nuclear stress test; lexiscan 0.4 mg injected over 15 seconds; no st changes; chest pain 7/10 with injection; normal hemodynamics; normal lexiscan stress test; isolated PACs/pauses; sestamibi images pending    Konrad Dolores, MS   [id number]

## 2013-07-19 NOTE — Progress Notes (Signed)
[facility]   [address]   [place] [address]     INITIAL ASSESSMENT  Case Management / Social Work       Estimated D/C Date: 12/23    PCP/Last visit: none -may need [facility] f/u    Home assessment/PLOF: pt lives at home  With family & is independent with ADL's per chart    Financials and Insurance: none     Community Services: none    DME's/Supplier: none    Inpatient Plan of Care: pt admitted for chest pain, pt had abnormal stress test, to have LHC today     CM Interventions: chart reviewed, will need full CM assessment, no PCP ? Need for [facility] f/u    Transportation: tbd    Barriers to discharge: none      D/C Plan and Needs:  tbd      Esther Hardy, RN, BSN  Case Manager  [facility]  Ph [phone]  Fx [phone]

## 2013-07-20 ENCOUNTER — Inpatient Hospital Stay: Payer: Self-pay

## 2013-07-20 LAB — BASIC METABOLIC PANEL
Anion Gap: 12 mMol/L (ref 7.0–18.0)
BUN / Creatinine Ratio: 20.3 Ratio (ref 10.0–30.0)
BUN: 16 mg/dL (ref 7–22)
CO2: 23.2 mMol/L (ref 20.0–30.0)
Calcium: 8.5 mg/dL (ref 8.5–10.5)
Chloride: 109 mMol/L (ref 98–110)
Creatinine: 0.79 mg/dL — ABNORMAL LOW (ref 0.80–1.30)
EGFR: >60 mL/min/{1.73_m2}
Glucose: 76 mg/dL (ref 70–99)
Osmolality Calc: 279 mOsm/kg (ref 275–300)
Potassium: 4.2 mMol/L (ref 3.5–5.3)
Sodium: 140 mMol/L (ref 136–147)

## 2013-07-20 LAB — D-DIMER, QUANTITATIVE: D-Dimer: 0.25 mg/L FEU (ref 0.19–0.52)

## 2013-07-20 MED ORDER — METOPROLOL TARTRATE 25 MG PO TABS
12.5000 mg | ORAL_TABLET | Freq: Two times a day (BID) | ORAL | Status: AC
Start: 2013-07-20 — End: 2013-08-19

## 2013-07-20 MED ORDER — LORAZEPAM 0.5 MG PO TABS
0.5000 mg | ORAL_TABLET | Freq: Every evening | ORAL | Status: DC | PRN
Start: 2013-07-20 — End: 2013-10-11

## 2013-07-20 MED ORDER — PRAVASTATIN SODIUM 40 MG PO TABS
20.0000 mg | ORAL_TABLET | Freq: Every day | ORAL | Status: DC
Start: 2013-07-20 — End: 2014-03-02

## 2013-07-20 MED ORDER — OXYCODONE-ACETAMINOPHEN 5-325 MG PO TABS
2.0000 | ORAL_TABLET | Freq: Four times a day (QID) | ORAL | Status: DC | PRN
Start: 2013-07-20 — End: 2013-08-04

## 2013-07-20 MED ORDER — ASPIRIN 81 MG PO CHEW
81.0000 mg | CHEWABLE_TABLET | Freq: Every day | ORAL | Status: DC
Start: 2013-07-20 — End: 2014-02-23

## 2013-07-20 NOTE — Progress Notes (Signed)
Patient received all discharge information, prescriptions and belongings. Telemetry removed and monitor room aware, IV out. Transport ordered to ER entrance where patient's vehicle is. Dr. Haywood Pao has said it is ok for this patient to drive home on his own

## 2013-07-20 NOTE — Discharge Instructions (Signed)
D/C Instructions-Radial Catheterization  Instructions after Your Radial Catheterization    Activity:    Rest and limit the use of the affected arm for first 24 hours.    NO driving for 24 hours.    You may take a shower starting the day after the procedure.    Do NOT get wrist heavily dirty for 3 days.  No gardening, no housecleaning, etc. for 3 days.    Do NOT lift anything greater than 10 pounds for 7 days.    Do NOT soak your wrist for the next 7 days.  No tub bath, no dishwashing, no swimming.      Care of Procedure Site:    Observe the procedure site daily.      Remove the dressing the day after the procedure.      Keep site clean, dry, and covered with a band-aid for 3 days.  Change band-aid as needed.    Slight tenderness or soreness at the procedure site, and/or tingling of the fingers/hand of same side as procedure site may occur for up to 3 days after the procedure.  If these symptoms continue, notify your physician.    If there is uncontrolled bleeding at procedure site or rapid swelling at procedure site, apply pressure to the site.   Call 911 immediately.    Notify your doctor to report any of the following:    Fever 101 or higher     Swelling at procedure site    Redness at procedure site    Drainage at procedure site    Increased discomfort at procedure site    New/Increased numbness in your hand of same side of procedure site.            Angina, Stable  The chest discomfort you have experienced today appears to be coming from your heart--a condition called angina. Angina is a pain in the heart due to poor blood flow from blockage by plaque in one or more of the small blood vessels that deliver oxygen to the heart muscle itself. Plaque is a fatty material made up of cholesterol and other particles that build up within the artery wall.  Exercise, increased activity, emotional upset, or stress can trigger this pain. With proper treatment and lifestyle changes to reduce risk factors, most people with  angina are able to maintain a full and active life.  Angina is not a heart attack. But if angina pain is severe or prolonged, it can lead to a heart attack, also called acute myocardial infarction, or AMI. Your angina is under control at this time. Therefore, it is safe for you to go home.    Home Care   Rest at home today and avoid any strenuous activity.   Take medicine (usually nitroglycerin) for chest pain exactly as prescribed. Keep your nitroglycerin with you at all times.   When taking nitroglycerin for angina, sit or lie down. The medication may make you feel dizzy.   Place one tablet under your tongue, or between your lip and gum, or between your cheek and gum. Let the tablet dissolve completely; do not chew or swallow the tablet.   If you use a spray, then spray once on orunder your tongue. Do not inhale. Close your mouth. Wait a few seconds before you swallow.   After taking one tablet or spraying once, continue sitting or lying for 5 minutes.   If the angina goes away completely, rest awhile and continue your normal routine.  If the angina continues or gets worse, CALL 911 immediately. Do NOT delay. You may be having a heart attack!   After you call 911, take a second tablet. Or, spray a second time. Wait another 5 minutes. If the angina still does not go away, take a third tablet, or spray a third time. Do not take more than 3 tablets, or spray more than 3 times, within 15 minutes. Stay on the phone with 911 for further instructions.  Note: Your healthcare provider may give you slightly different instructions than those above. If so, follow them carefully.  Prevention   Learn how to take your own blood pressure. Keep a record of your results. Ask your doctor which readings mean that you need medical attention.   Maintain a healthy weight. Get help to lose any extra pounds.   Cut back on salt.   Limit canned, dried, packaged, and fast foods.   Don't add salt to your food at the  table.   Season foods with herbs instead of salt when you cook.   Begin an exercise program. Ask your doctor how to get started. You can benefit from simple activities such as walking or gardening.   Break the smoking habit. Enroll in a stop-smoking program to improve your chances of success.   Avoid stressful situations. Learn stress-management techniques.  Follow Up  with your doctor as instructed.  If an x-ray or ECG (electrocardiogram) was done , it will be reviewed by another specialist. You will be notified of any new findings that may affect your care.  Get Prompt Medical Attention  if any of the following occur:   Your pain recurs and is not relieved by your usual dose of nitroglycerin   Shortness of breath or increased pain with breathing   Angina with weakness, dizziness, fainting, heavy sweating, nausea, or vomiting   A change in the type of pain:   It feels different   Becomes more severe   Lasts longer or occurs more often   Spreads to new areas (shoulder, arm, neck, jaw, or back)   Less exertion required before the pain appears   Extreme drowsiness, confusion   Dizziness or vertigo (dizziness with spinning sensation)   Weakness of an arm or leg or one side of the face   Difficulty with speech or vision   [address], [address] [place], [place] [address]. All rights reserved. This information is not intended as a substitute for professional medical care. Always follow your healthcare professional's instructions.          Eating Healthy  Changing the way you eat can reduce many of your risk factors. It can lower your cholesterol, blood pressure, and weight. Your diet doesn't have to be bland and boring to be healthy. Just follow these 3 steps: eat less fat and salt, and eat more fiber. Your whole family can benefit from healthy eating habits.    1. Eat Less Fat   Eat fewer fatty cuts of meat and more fish.   Avoid butter and lard, and use less margarine.   Avoid  foods containing palm, coconut, or hydrogenated oils.   Eat fewer high-fat dairy products like cheese, ice cream, and whole milk.   Get a heart-healthy cookbook and try some low-fat recipes.   2.Eat Less Salt   Don't add salt to food when cooking, and keep the saltshaker off the table.   Don't use high-salt ingredients such as MSG, soy sauce, baking soda,  and baking powder.   Instead of salt, season your food with herbs and flavorings such as lemon, garlic, and onion.   3. EatMore Fiber   Eat fresh fruits and vegetables.   Add oats, whole-grain rice, and bran to your diet.   Beans and potatoes are excellent sources of fiber.   When you eat more fiber, be sure to drink more water to prevent constipation.    [address], [address] [place], [place] [address]. All rights reserved. This information is not intended as a substitute for professional medical care. Always follow your healthcare professional's instructions.          Medications for Heart Disease  Like many people with heart disease, you'll likely take a few different types of medication. Some reduce the chance of heart attack and stroke. Others control risk factors such as blood pressure and cholesterol. You may also take medications for other heart problems, such as heart failure or arrhythmia (irregular heart rhythm). And other health conditions, such as diabetes, likely require medication, too. Keeping track of your medications and knowing what each does can get confusing.  Medications for heart disease  Many people with heart disease take the 4 medications described in this chart. Other common medications are listed later. With your doctor's or cardiac rehab team's help, review the types of medications that have been prescribed for you.    Type of medication What it does   Statin Reduces the amount of LDL ("bad') cholesterol and other fats in the blood, which reduces chance of clogged arteries.  May improve levels of HDL  ("good") cholesterol.   ACE inhibitor or angiotensin receptor blocker (ARB)t Lowers blood pressure and decreases strain on the heart. This makes it easier for the heart to pump and improves blood flow.   Aspirin Helps prevent blood clots, which could block an artery.  May reduce your risk of a heart attack.   Beta-blocker Lowers blood pressure and slows heart rate.  May strengthen the heart's pumping action over time.     Other medications you may take    Type of medication What it does   Antiarrhythmic Helps slow and regulate a fast or irregular heartbeat (arrhythmia)   Anticoagulant Helps reduce the risk that a blood clot will form and block the artery.   Antihypertensive Helps lower blood pressure.   Calcium channel blocker Helps blood flow more easily through the arteries.   Digoxin Slows heart rate and helps the heart pump more with each beat.   Diuretic Helps rid the body of excess water. This is important if you have high blood pressure or heart failure.   Nitrate (nitroglycerine) Helps prevent and treat angina.   Vasodilator Helps blood flow more easily through the arteries.      [address] [place], [place] [address]. All rights reserved. This information is not intended as a substitute for professional medical care. Always follow your healthcare professional's instructions.               Checklist at Discharge:    Activity:  Light Independent & Advance as Tolerated    Diet:  Low Saturated Fat / Low Cholesterol Diet  2 Gram Sodium (Salt) Restricted Diet    Medications:  Aspirin:    Prescribed  Statin:                Prescribed  (lipitor)  B-Blocker:    Prescribed  (metoprolol)  :  For the promotion of your health, you are advised not to smoke and to monitor your weight daily.      Should your symptoms worsen or recur, contact your physician.    In the event of an emergency, severe shortness of breath, unrelieved chest pain or chest pressure / tightness, dial  911.    FOLLOW UP    You will have an appointment scheduled with the [facility]. Park in the [address], go to [facility] to check in, ask for [employer].

## 2013-07-20 NOTE — Progress Notes (Signed)
Patient got tired of waiting on transport so he decided to walk out on his own

## 2013-07-20 NOTE — Discharge Summary (Signed)
PRIMARY CARE PHYSICIAN: None.    CHIEF COMPLAINT: Chest pain.    DISCHARGE DIAGNOSES:  1.  Minimal nonobstructive coronary artery disease.  2.  Atypical chest pain, uncontrolled hypertension.  3.  Mild hyperlipidemia.  4.  History of tobacco abuse.  5.  Counseling provided.  6.  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    DISCHARGE MEDICATION:  1.  Aspirin 81 mg once a day.  2.  Pravachol on 20 mg p.o. once a day.  3.  Percocet p.r.n.  4.  Metoprolol 12.5 once a day.    BRIEF HOSPITAL COURSE: This is a very pleasant 47 year old male,  came to the ER with complaints of chest pain.  He had serial  cardiac enzymes, which were negative.  He had a D-dimer that was  negative.  He then had a stress test, which was abnormal so he  went for cardiac cath yesterday via radial approach.  His  cardiac cath shows only minor coronary artery disease, very  insignificant.  He will be discharged with counselling for his  diet exercise as well as medical therapy.  The rest of his stay  has remained unremarkable except that he does have heel pain,  which is severe enough for him to cause limping.  X-rays were  done.  There was no observed fractures or dislocation but there  is plantar calcaneal spurs causing him to have the pain.  He has  a brace at home, which he is going to continue to use.  Rest of  the stay has remained unremarkable.  He did also get a  echocardiogram.  No evidence of any effusion.  EF is within  normal limits.  At this point, the patient is being discharged  home to follow up as an outpatient.  He does have a little bit  of anxiety due to his social and family situation. He could  benefit from low-dose Ativan that I am going to prescribe 0.5  once a day p.r.n.  Advised him to take it only minimally and  follow up with the [facility] as an outpatient.    PHYSICAL EXAMINATION:  GENERAL:  He is awake and alert.  VITAL SIGNS: Blood pressure is 140/75, heart is 72, respiratory  rate 18,  afebrile.  LUNGS:  Clear.  ABDOMEN:  Bowel sounds are positive.  EXTREMITIES:  .    LABORATORY DATA: Include H and H of 16 and 47, WBCs 9, and  platelets are 265.  BUN and creatinine is 60 and 0.7.  Troponin  and cardiac enzymes negative.  D-dimer negative.    Extended  to [place] spent 45 minutes discharging this  patient and this is the discharge summary to [employer].        [id number]  DD: 07/20/2013 14:58:40  DT: 07/20/2013 18:52:45  JOB: [id number]

## 2013-07-20 NOTE — Progress Notes (Signed)
[facility]   [address]   [place] [address]     PROGRESS NOTE  Case Management / Social Work       Estimated D/C Date: 12/23    Inpatient plan of care: pt had LHC yesterday with no stents. Pt had echo today, awaiting results.    CM Interventions: chart reviewed, dcp discussed with pt, pt has no insurance/PCP, agreeable to [facility] f/u on d/c. Pt seen by [facility]    Barriers to discharge: none    D/C Plan and Needs: home with family support. CM to follow & assist as needed       Esther Hardy, RN, BSN  Case Manager  [facility]  Ph [phone]  Fx [phone]

## 2013-07-20 NOTE — Progress Notes (Signed)
Patient back from testing.

## 2013-07-20 NOTE — PT Eval Note (Signed)
[facility]  [facility]                                                         [address]  [place] [address]  Department of Rehabilitation Services  [phone]  Gary Vazquez.    CSN: [id number]    MED TELEMETRY STEP-DOWN   [id number]    Physical Therapy Evaluation    Time of treatment: Time Calculation  PT Received On: 07/20/13  Start Time: 1500  Stop Time: 1525  Time Calculation (min): 25 min    PT Visit Number: 1                                                                                 Precautions and Contraindications:   Precautions  Weight Bearing Status: no restrictions    Assessment:      Gary Vazquez. is a 47 y.o. male admitted 07/18/2013 with difficulty walking as a result of severe L heel pain.  Patient reports pain began after kicking logs.  Patient is able to demonstrate independent ambulation, however maintains weight thru L toes.  Recommend patient use SPC or crutches at home to relieve pain with ambulation and follow up for further diagnostics.  Also recommend ice and relaxation.  Patient is being discharged from the hospital and no further follow up is needed in this setting. PT Impairments: Gait impairment.  Progress: Discontinue PT    Risks/Benefits/POC Discussed with Pt/Family: With patient    Plan:   Discontinue PT services    Treatment/Interventions: Gait training;Patient/family training     PT Frequency: one time visit     DISCHARGE RECOMMENDATIONS   DME Recommended for Discharge:  (None needed)    Discharge Recommendation: Home with supervision (recommend further follow up in re: to L heel pain)    History of Present Illness      Medical Diagnosis: Chest pain [786.50]  Chest pain, unspecified [786.50] (Chest pain)  Chest pain    Gary Vazquez. is a 47 y.o. male admitted on 07/18/2013 with L heel pain.  Patient s/p negative cardiac work up (including stress test, cardiac  cath and echo) for chest pain.    Patient Active Problem List   Diagnosis   . Chest pain   . Tobacco dependence   . Heel pain, left        X-Rays/Tests/Labs:  No fracture    Past Medical/Surgical History:  History reviewed. No pertinent past medical history.   Past Surgical History   Procedure Date   . Appendectomy    . 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    Prior Level of Function  Prior level of function: Ambulates independently  Baseline Activity Level: Community ambulation  Driving: independent  Employment: PT  DME Currently at Home: Crutches;Single point cane    Subjective   "But why does it hurt so much"  Patient is agreeable to participation in the therapy session. Nursing clears patient for therapy.     Patient Goal  Patient Goal: To be pain free    Pain:     Pain Assessment  Pain Assessment: Numeric Scale (0-10)  Pain Score: 10-severe pain  POSS Score: Awake and Alert  Pain Location:  (Heel)  Pain Orientation: Lower;Outer  Pain Descriptors: Aching;Throbbing;Stabbing;Tingling  Pain Frequency: Increases with movement (10/10 with WB and 2/10 at rest)  Effect of Pain on Daily Activities: severe  Patient's Stated Comfort Functional Goal: 0-No pain          Objective:   Patient's medical condition is appropriate for Physical therapy intervention at this time    Observation of Patient/Vital signs  Patient is out of bed, ambulating with no medical equipment in place.    Vital Signs : Stable with no s/s of distress noted through out session    Cognition  Cognition  Arousal/Alertness: Appropriate responses to stimuli  Attention Span: Appears intact  Orientation Level: Oriented X4  Memory: Appears intact  Following Commands:  Follows all commands and directions without difficulty  Safety Awareness: independent  Insights: Educated in Engineer, building services  Problem Solving: Able to problem solve independently    Musculoskeletal Examination  Gross ROM  Right Lower Extremity ROM: within functional limits  Left Lower Extremity ROM: within functional limits    Gross Strength  Right Lower Extremity Strength: within functional limits  Left Lower Extremity Strength: within functional limits (ankle NT due to pain)    Balance  within functional limits    Functional Mobility:   Bed mobility:  Sit to Stand: Independent  Stand to Sit: Independent    Locomotion  Ambulation: independently (without AD)  Ambulation Distance (Feet): 25 Feet  Pattern:  (antalgic with no L heel strike.  Step to pattern leading with the L LE.  Patient maintains weight thru L toes with L knee in flexion.)    Participation and Activity Tolerance  Participation and Endurance  Participation Effort: excellent  Endurance: Tolerates < 10 min exercise, no significant change in vital signs    Treatment Activities:         "X" All That Apply    Therapeutic Exercise    Functional Activity x   Gait Training x   Stair Training    Neuromuscular Re-education    Balance Training    CPM Adjustment    Self Care Train    DME Adjustments    Brace Fitting    Pt/Family Education x   Wound Care    Manual Therapy    Evaluation x     Educated the patient to role of physical therapy, plan of care, goals of therapy and safety with mobility and ADLs.    Individuals Educated :  (X in all that apply)           Patient x           Spouse/Significant Other            Family            Caregiver            Nursing Staff            Other (specify)  Strategy:            Explanation x           Demonstration x           Handout            Video            Other (specify)    Response:             Verbalized Understanding x           Demonstrated Understanding x           Questionable Understanding            No  Understanding            Refused Education      Pt positioned at conclusion of session: (please place "x" in all that apply)   Supine    Sitting x   Family/visitors present     Needs in reach  x   Bed/chair alarm set    No distress x   Distress, RN notified    Affected Extremity Elevated    CPM    SCDs/foot pumps applied    Ice applied    Other       PT Team Communication: (please place "x" in all that apply)   Spoke c RN/CNA x   Spoke c RNCM/SW    Spoke c MD/NP/PA    Re: Pt position    Re: D/C needs x   Re: Pt participation with Therapy x   Re: Vital Signs    Re: Further Recommendations  x   Re: CPM settings    White board updated       RN notified of session outcome.     Recommend patient ambulate with SPC or crutches.    Gary Vazquez, PT

## 2013-07-21 LAB — HEMOGLOBIN A1C: Hgb A1C, %: 5.7 %

## 2013-07-24 ENCOUNTER — Emergency Department
Admission: EM | Admit: 2013-07-24 | Discharge: 2013-07-24 | Disposition: A | Discharge status: Home / Self Care | Payer: Self-pay | Attending: Emergency Medicine | Admitting: Emergency Medicine

## 2013-07-24 ENCOUNTER — Emergency Department: Payer: Self-pay

## 2013-07-24 DIAGNOSIS — F17200 Nicotine dependence, unspecified, uncomplicated: Secondary | ICD-10-CM | POA: Insufficient documentation

## 2013-07-24 DIAGNOSIS — M545 Low back pain, unspecified: Secondary | ICD-10-CM | POA: Insufficient documentation

## 2013-07-24 MED ORDER — PREDNISONE 20 MG PO TABS
ORAL_TABLET | ORAL | Status: AC
Start: 2013-07-24 — End: ?
  Filled 2013-07-24: qty 3

## 2013-07-24 MED ORDER — PREDNISONE 20 MG PO TABS
60.0000 mg | ORAL_TABLET | Freq: Once | ORAL | Status: AC
Start: 2013-07-24 — End: 2013-07-24
  Administered 2013-07-24: 60 mg via ORAL

## 2013-07-24 MED ORDER — OXYCODONE-ACETAMINOPHEN 5-325 MG PO TABS
1.0000 | ORAL_TABLET | Freq: Once | ORAL | Status: AC
Start: 2013-07-24 — End: 2013-07-24
  Administered 2013-07-24: 1 via ORAL

## 2013-07-24 MED ORDER — OXYCODONE-ACETAMINOPHEN 5-325 MG PO TABS
1.0000 | ORAL_TABLET | Freq: Four times a day (QID) | ORAL | Status: DC | PRN
Start: 2013-07-24 — End: 2013-10-11

## 2013-07-24 MED ORDER — OXYCODONE-ACETAMINOPHEN 5-325 MG PO TABS
ORAL_TABLET | ORAL | Status: AC
Start: 2013-07-24 — End: ?
  Filled 2013-07-24: qty 1

## 2013-07-24 MED ORDER — CYCLOBENZAPRINE HCL 10 MG PO TABS
10.0000 mg | ORAL_TABLET | Freq: Once | ORAL | Status: AC
Start: 2013-07-24 — End: 2013-07-24
  Administered 2013-07-24: 10 mg via ORAL

## 2013-07-24 MED ORDER — CYCLOBENZAPRINE HCL 10 MG PO TABS
ORAL_TABLET | ORAL | Status: AC
Start: 2013-07-24 — End: ?
  Filled 2013-07-24: qty 1

## 2013-07-24 MED ORDER — CYCLOBENZAPRINE HCL 10 MG PO TABS
10.0000 mg | ORAL_TABLET | Freq: Once | ORAL | Status: AC
Start: 2013-07-24 — End: 2013-07-24

## 2013-07-24 MED ORDER — PREDNISONE 20 MG PO TABS
40.0000 mg | ORAL_TABLET | Freq: Every morning | ORAL | Status: AC
Start: 2013-07-24 — End: 2013-07-29

## 2013-07-24 NOTE — ED Notes (Signed)
C/o right low back pain into right upper hip and thigh. Pain "since Christmas" states had a bad foot and has been favoring right leg. Has appt. 08/03/12 with transition clinic.

## 2013-07-24 NOTE — ED Provider Notes (Signed)
[facility] EMERGENCY DEPARTMENT  History & Physical Exam       Patient Name: Gary,[name]  Encounter Date:  07/24/2013  Attending Physician: Tori Milks, MD  Treating Provider: Wilmon Arms, PA  PCP: Christa See, MD  Patient DOB:  1966-05-04  MRN:  [id number]  Room:  E53/E53-A      History of Presenting Illness     Chief complaint: Back Pain    HPI/ROS is limited by: none  HPI/ROS given by: patient    Location: lower back and right side  Duration: 2 days  Severity: moderate    Gary Vazquez. is a 47 y.o. male who presents with Back Pain  Pt noticed LBP upon awakening 2 days ago. No known injury, trauma or fall. Pain extreme and localized to lumbar area and radiates laterally and around the R hip/groin. Pain pressure like and occasionally burning. Was d/c from [facility] 4 days ago after 3 day admission for chest pain and failed stress test as well as cardiac cath. Pt has been walking on tip toes on L foot and is concerned that this is affecting his R hip LBP. Tried 2 percocet that were left over from recent hospitalization. Denies dysuria, hematuria, numbness and weakness in to LE bilat, saddle paraesthesias, abdominal pain, nausea, vomiting, recent procedures into back. Has appt with transition clinic on 1/6.       Review of Systems     Review of Systems   Constitutional: Negative for fever and chills.   Respiratory: Negative for shortness of breath and wheezing.    Cardiovascular: Negative for chest pain.   Gastrointestinal: Negative for nausea, vomiting and abdominal pain.   Genitourinary: Negative for dysuria and hematuria.   Musculoskeletal: Positive for back pain and gait problem.   Skin: Negative for rash.   Neurological: Negative for weakness and numbness.   Psychiatric/Behavioral: Negative for agitation.          Allergies & Medications     Pt  has no known allergies.    Current/Home Medications    ASPIRIN 81 MG CHEWABLE TABLET    Chew 1 tablet (81 mg total) by mouth daily.    LORAZEPAM  (ATIVAN) 0.5 MG TABLET    Take 1 tablet (0.5 mg total) by mouth nightly as needed for Anxiety.    METOPROLOL (LOPRESSOR) 25 MG TABLET    Take 0.5 tablets (12.5 mg total) by mouth 2 (two) times daily.    OXYCODONE-ACETAMINOPHEN (PERCOCET) 5-325 MG PER TABLET    Take 2 tablets by mouth every 6 (six) hours as needed.    PRAVASTATIN (PRAVACHOL) 40 MG TABLET    Take 0.5 tablets (20 mg total) by mouth daily.        Past Medical History     Pt has no past medical history on file.     Past Surgical History     Pt  has past surgical history that includes Appendectomy and Abdominal surgery. cardiac cath     Family History     The family history includes Diabetes in his father; Heart attack in his father; Heart disease in his father; Hyperlipidemia in his father; and Hypertension in his father.     Social History     Pt reports that he has been smoking.  He has never used smokeless tobacco. He reports that he does not drink alcohol or use illicit drugs.     Physical Exam     Blood pressure 140/82, pulse 74, temperature  97.7 F (36.5 C), resp. rate 18, weight 98.1 kg, SpO2 99.00%.      Physical Exam   Constitutional: He is oriented to person, place, and time. Vital signs are normal. He appears well-developed and well-nourished.  Non-toxic appearance. He appears distressed.   HENT:   Head: Normocephalic and atraumatic.   Right Ear: External ear normal.   Left Ear: External ear normal.   Nose: Nose normal.   Mouth/Throat: Uvula is midline, oropharynx is clear and moist and mucous membranes are normal.   Eyes: Conjunctivae normal are normal. No scleral icterus.   Cardiovascular: Normal rate, regular rhythm and normal heart sounds.    No murmur heard.  Pulses:       Dorsalis pedis pulses are 2+ on the right side, and 2+ on the left side.   Pulmonary/Chest: Effort normal and breath sounds normal. He has no wheezes.   Abdominal: Soft. Normal appearance and normal aorta. There is no tenderness.   Musculoskeletal:        Lumbar back:  He exhibits decreased range of motion, tenderness, pain and spasm. He exhibits no deformity.        Back:    Neurological: He is alert and oriented to person, place, and time. He has normal strength. No sensory deficit. Coordination abnormal. GCS eye subscore is 4. GCS verbal subscore is 5. GCS motor subscore is 6.   Reflex Scores:       Patellar reflexes are 2+ on the right side and 2+ on the left side.       Sensation intact to light touch. Strength 5/5 in LE bilat   Skin: Skin is warm, dry and intact. No rash noted.   Psychiatric: He has a normal mood and affect. His speech is normal and behavior is normal.            Diagnostic Results     The results of the diagnostic studies below have been reviewed by myself:    Labs  Results     ** No Results found for the last 24 hours. **          Radiologic Studies  Echocardiogram Adult Complete W Clr/ Dopp Waveform    07/20/2013  1.  Normal left ventricular size and systolic function,     ejection fraction 60% to 65%, with no regional wall motion     abnormalities. 2.  No significant valvular or structural heart disease. 3.  No evidence for pericarditis.       Xr Foot Left Ap Lateral And Oblique    07/18/2013  No observed fracture or dislocation left foot. Plantar calcaneal spurs on 10/22/2004  ReadingStation:[id number]     Xr Chest Ap Portable    07/18/2013  No focal infiltrate. Normal heart size. No change from 03/16/2005.  ReadingStation:[email or web address]       EKG: none       Medical Decision Making     This patient presents with back/neck pain that seems musculoskeletal in origin. Based on my history and examination, several diagnoses including cauda equina syndrome, infection, AAA, kidney stones, have been considered and are unlikely. No life-threatening etiologies of back pain are discernible at this time, and the patient's symptoms will be managed with symptomatic care. I advised the patient to return to the emergency department immediately should they develop worsening pain,  fever, weakness, bowel or bladder symptoms, or any acute concerns .  The patient was deemed well enough for discharge.  Diagnostic impression and plan were discussed  with the patient and/or family.  If ordered, results of lab/radiology tests were discussed with the patient and/or family. Questions were answered and concerns were addressed.  The patient was encouraged to follow-up with their primary care provider or specialist if not improved.    Sedation, tylenol, and constipation warnings provided    The results of all diagnostic studies performed during this encounter have been review by myself.    Discussed patient with Tori Milks, MD and patient was not directly evaluated by Tori Milks, MD.  Tori Milks, MD agrees with assessment and plan. This document is generated from an EMR system and may have additions and omissions that were not intended by the user. Quayshawn Nin R. Thelma Barge, PA-C.     Procedures / Critical Care     None     Diagnosis / Disposition     Clinical Impression  1. Right low back pain        Disposition  ED Disposition     Discharge [name] discharge to home/self care.    Condition at disposition: Stable            Follow up for Discharged Patients  transition clinic on 08/03/12 as previously scheduled            Prescriptions for Discharged Patients  New Prescriptions    CYCLOBENZAPRINE (FLEXERIL) 10 MG TABLET    Take 1 tablet (10 mg total) by mouth once in the emergency department.    OXYCODONE-ACETAMINOPHEN (PERCOCET) 5-325 MG PER TABLET    Take 1 tablet by mouth every 6 (six) hours as needed for Pain.    PREDNISONE (DELTASONE) 20 MG TABLET    Take 2 tablets (40 mg total) by mouth every morning with breakfast.                Henreitta Leber [place], [place]  07/24/13 2330

## 2013-07-24 NOTE — ED Notes (Signed)
Bed:E53-A<BR> Expected date:<BR> Expected time:<BR> Means of arrival:<BR> Comments:<BR> Isolation clean, influenza

## 2013-07-24 NOTE — Discharge Instructions (Signed)
Thank you for choosing [facility] for your emergency care needs. We strive to provide EXCELLENT care to you and your family.      YOUR ACCURATE CONTACT INFORMATION IS VERY IMPORTANT    Before leaving please check with registration to make sure we have an up-to-date contact number. A Toll-free post discharge customer service number is available to update your registration/insurance information as well as answer any billing questions or concerns. That number is [phone].       IF YOU DO NOT CONTINUE TO IMPROVE OR YOUR CONDITION WORSENS, PLEASE CONTACT YOUR DOCTOR OR RETURN IMMEDIATELY TO THE EMERGENCEY DEPARTMENT.      EXTRA AVAILABLE RESOURCES:    1. DOCTOR REFERRALS  a. Call  our Physician Referral Line @ [phone] If you need  further assistance with referrals to primary care or specialty services our Case Manager may assist at [phone].  2. FREE HEALTH SERVICES  a. www.freemedicalsearch.org  b. http://www.[facility].org  May be utilized if you need help with health or social services, please call 2-1-1 for a free referral to resources in your area. 2-1-1 is a free service connecting people with information on health insurance, free clinics, pregnancy, mental health, dental care, food assistance, housing, and substance abuse counseling.  3. MEDICAL RECORDS AND TESTS  Certain laboratory test results do not come back the same day, for example: urine cultures may take 3 days. We will attempt to contact you if other important findings are noted. Some lab testing may take 2-5 days. Radiology films are reviewed again to ensure accuracy. If there is any discrepancy, we will notify you. If you have questions or concerns, our Case Manager is available Monday thru Friday, 7:30a-3:00p, for follow up or test result questions. Please call [phone].  4. ED PATIENT ADVOCATE SERVICES: [phone]. Contact for general questions and concerns, daily 11:00a-11:00p.      DISCHARGE  MESSAGE:     YOU ARE THE MOST IMPORTANT FACTOR IN YOUR RECOVERY. Follow   the above instructions carefully. Take your medicines as prescribed. Most   important, see your  doctor in follow-up  as recommended by your ED physician.   Call your doctor if your condition worsens or if you have any new, worsening, or   severe symptoms. If you need further care and cannot be seen by your    recommended follow-up doctor, the Emergency Department Case Manager will   be available to help as needed [phone].  If you require immediate    assistance, return to the Emergency Department or call 911.    Pharmacy information  For your convenience please visit [facility] for your prescription needs. [facility] has extended evening and weekend hours, including holidays, to ensure patients can begin taking medications as soon as possible.  Most insurance plans are accepted.    Pharmacy Hours:  Monday - Friday: 8:30a to 8:30p  Saturday: 9a to 1p  Sunday: 9a to 1p and 6p to 9p  Holidays: 9a to 1p    Pharmacy Phone: [phone]      [facility] has been providing home care solutions for independent living since 1984. Servicing [place] . [facility] is a full service home medical provider of home oxygen and respiratory care, medical equipment and supplies. [phone].    Thanks Again, for allowing [facility]   Emergency Department to serve you.    You may use your narcotic pain medication as   directed on your prescription. Do not drink alcohol or drive/operate heavy machinery while taking narcotic pain medication. Do not take any additional tylenol with the narcotic pain medication you are being prescribed. You may also take ibuprofen/motrin in addition to the narcotic pain medication you are being prescribed. Ibuprofen/motrin may be taken as directed on bottle. Take ibuprofen/motrin with food.

## 2013-07-25 NOTE — ED Provider Notes (Signed)
This patient presented to the Emergency Department today and was seen by Physician Assistant Henreitta Leber.  We discussed and agreed upon this patient's management/treatment and plan. Tori Milks, MD          Tori Milks, MD  07/25/13 [id number]

## 2013-08-02 ENCOUNTER — Ambulatory Visit: Payer: Self-pay | Admitting: Family

## 2013-08-04 ENCOUNTER — Encounter: Payer: Self-pay | Admitting: Family

## 2013-08-04 ENCOUNTER — Ambulatory Visit: Payer: Self-pay | Attending: Family | Admitting: Family

## 2013-08-04 VITALS — BP 158/87 | HR 89 | Resp 18

## 2013-08-04 DIAGNOSIS — R079 Chest pain, unspecified: Secondary | ICD-10-CM | POA: Insufficient documentation

## 2013-08-04 DIAGNOSIS — M7580 Other shoulder lesions, unspecified shoulder: Secondary | ICD-10-CM

## 2013-08-04 DIAGNOSIS — Z09 Encounter for follow-up examination after completed treatment for conditions other than malignant neoplasm: Secondary | ICD-10-CM | POA: Insufficient documentation

## 2013-08-04 DIAGNOSIS — M779 Enthesopathy, unspecified: Secondary | ICD-10-CM

## 2013-08-04 DIAGNOSIS — M898X9 Other specified disorders of bone, unspecified site: Secondary | ICD-10-CM

## 2013-08-04 MED ORDER — NAPROXEN 500 MG PO TABS
500.0000 mg | ORAL_TABLET | Freq: Two times a day (BID) | ORAL | Status: DC
Start: 2013-08-04 — End: 2013-10-11

## 2013-08-04 NOTE — Progress Notes (Signed)
Gary Vazquez.. lacks PCP and he does not have insurance coverage. Scheduled for follow up in the [facility] to help establish a PCP.  Chart reviewed to help determine transition to appropriate provider.   Met with pt today . He lives with his wife in [place] .  She is employed and he is not able to go back to work at this time due to  heel pain  . NP wrapped his right handin ace bandage due to swelling. He hangs dry wall for a living.  Chronic Care Management Progress Note     08/04/2013  10:11 AM  Care Team Member: Lynita Lombard Burkley Dech, RN  Patient Active Problem List    Diagnosis Date Noted   . Chest pain 07/18/2013   . Tobacco dependence 07/18/2013   . Heel pain, left 07/18/2013       Topics discussed today include:transition to the Free medical clinic to establish PCP.   providing additional documentation need for the hospital  If  applying for financial assistance since he is unemployed    Medication assistance . [facility] med assistance  For new RX provided   The next patient contact will be in 1 week(s) with NP to discuss  To assess response to new antiinflammatory  And for  PRN follow up if an appointment is not secured at the [facility]

## 2013-08-04 NOTE — Patient Instructions (Addendum)
Take naprosyn with food and about 30 mins apart from aspirin  Stop Percocet  Will assist with paperwork to get in to [facility]  Once in [facility] help will be given for a Cardiologist, Orthopedist and possible Rheumatologist if needed  Return in 1 week for follow up in the Transition clinic  If increased pain in wrist or fever go to ER  Heel cup inserts to both shoes asap

## 2013-08-04 NOTE — Progress Notes (Signed)
Subjective:       Patient ID: Gary Vazquez. is a 48 y.o. male.    HPI    Mr Dills is here today for follow up hospitalization.  This is a very pleasant 48 year old male, went to the ER with complaints of chest pain. He had serial cardiac enzymes, which were negative. He had a D-dimer that was negative. He then had a stress test, which was abnormal so he went for cardiac cath yesterday via radial approach. His cardiac cath shows only minor coronary artery disease, very insignificant. He will be discharged with counselling for his diet exercise as well as medical therapy. The rest of his stay has remained unremarkable except that he does have heel pain,  which is severe enough for him to cause limping. X-rays were done. There was no observed fractures or dislocation but there is plantar calcaneal spurs causing him to have the pain.     PRIMARY CARE PHYSICIAN: None.   CHIEF COMPLAINT: Chest pain.   DISCHARGE DIAGNOSES:   1. Minimal nonobstructive coronary artery disease.   2. Atypical chest pain, uncontrolled hypertension.   3. Mild hyperlipidemia.   4. History of tobacco abuse.   5. Counseling provided.   6. 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   DISCHARGE MEDICATION:   1. Aspirin 81 mg once a day.   2. Pravachol on 20 mg p.o. once a day.   3. Percocet p.r.n.   4. Metoprolol 12.5 once a day.         Review of Systems   Constitutional: Negative for fever and chills.   HENT: Negative.    Respiratory:        Chest tightness with first 4 days of metoprolol within 30 mins of taking it that has improved   Gastrointestinal: Negative for nausea, vomiting, abdominal pain, diarrhea and constipation.   Musculoskeletal: Positive for arthralgias, back pain, gait problem and joint swelling.        Left heel pain   Neurological: Negative for dizziness and syncope.   Psychiatric/Behavioral: Positive for sleep disturbance. The patient is nervous/anxious.            Objective:    Physical Exam    Constitutional: He is oriented to person, place, and time. He appears well-developed and well-nourished.   HENT:   Head: Normocephalic and atraumatic.   Neck: Neck supple.   Cardiovascular: Normal rate and regular rhythm.    Pulmonary/Chest: Breath sounds normal.   Abdominal: Soft. Bowel sounds are normal.   Musculoskeletal:        Toe walking on left foot  Edema to right wrist and thumb   Neurological: He is alert and oriented to person, place, and time.   Skin: Skin is warm and dry.   Psychiatric: He has a normal mood and affect.           Assessment:       Heel spur left  Arthralgia right wrist    CAD  Plan:       Naprosyn 500mg  twice daily take 30 mins from aspirin for am dose  Stop percocet  Heel cups asap  Ace applied to right wrist  Back brace if pt has one for now  Will assist with [facility] for PCP, Cardiologist, Orthopedist and Rheumatologist as needed    increased pain or fever go to ER

## 2013-08-11 ENCOUNTER — Ambulatory Visit (HOSPITAL_BASED_OUTPATIENT_CLINIC_OR_DEPARTMENT_OTHER): Payer: Self-pay | Admitting: Family

## 2013-08-11 ENCOUNTER — Encounter: Payer: Self-pay | Admitting: Family

## 2013-08-11 VITALS — BP 149/86 | HR 88 | Resp 18 | Ht 74.0 in | Wt 217.0 lb

## 2013-08-11 DIAGNOSIS — M129 Arthropathy, unspecified: Secondary | ICD-10-CM

## 2013-08-11 DIAGNOSIS — M1990 Unspecified osteoarthritis, unspecified site: Secondary | ICD-10-CM | POA: Insufficient documentation

## 2013-08-11 NOTE — Patient Instructions (Signed)
Wear heel cups in both shoes  Keep right wrist elevated at home with pillows or homemade sling and keep wrapped  Continue Naprosyn as directed  Keep follow up appt with [facility] 1/15 at 5p  If Chest pain occurs call 911

## 2013-08-11 NOTE — Progress Notes (Signed)
Subjective:       Patient ID: Gary Vazquez. is a 48 y.o. male.    HPI    Mr Bachus is here today for follow up right wrist and left heel pain.  He was given a heel cup at last appointment and states that it has helped greatly.  He is now able to put weight on his heel with minimal discomfort.  He is still having a large amount of swelling of his right wrist.  He was given Naprosyn at last visit and it take some of the swelling down, relieves his right hip pain and helps with general joint pain greatly.  He is in need for further work up for arthralgias throughout his body.  His hands appear with arthritis and almost appear as if he has had a neck injury in the past the muscles between his thumb and first finger bilaterally are severely flat with noticeable fasciculation while at rest.  He is having difficulty with opening jars and his grip appears weakened. He has been a Psychologist, counselling for many years. He states that he does have times when his neck hurts and sends pain to his arms and hands.  At night he wakes up with both hands "asleep".  He has a new pt appt with [facility] tomorrow at 5p for further evaluation and treatment, to establish PCP.    Review of Systems   Constitutional: Positive for activity change and fatigue. Negative for fever and chills.   Respiratory: Negative for shortness of breath.    Cardiovascular: Negative for chest pain.   Musculoskeletal: Positive for arthralgias, back pain, gait problem, joint swelling and neck pain.           Objective:    Physical Exam   Constitutional: He is oriented to person, place, and time. He appears well-developed and well-nourished.   HENT:   Head: Normocephalic.   Neck: Neck supple.   Cardiovascular: Normal rate.    Abdominal: He exhibits no distension. There is no tenderness.   Musculoskeletal: He exhibits edema (right wrist and joints of all fingers).        Walking on ball of left foot but improving   Neurological: He is alert and oriented to person,  place, and time.   Skin: Skin is warm and dry.   Psychiatric: He has a normal mood and affect.           Assessment:       Arthralgia  R wrist edema  L heel spur  Cervical radiculopathy        Plan:       Continue to elevate right arm and wrap right wrist  Wear heel cups daily in both shoes  Keep appt with [facility] for 1/15 for eval and PCP establishment

## 2013-08-11 NOTE — Progress Notes (Signed)
Gary Vazquez came in today for his follow appointment  Per NP he is doing, better pain is more controlled. He reports the ha has an appointment at the [facility] on Jan-15-15 @ 1700 to be seen. Once establish no additional follow up is need at the [facility]

## 2013-08-17 ENCOUNTER — Telehealth: Payer: Self-pay

## 2013-08-17 NOTE — Telephone Encounter (Signed)
Mr Gary Vazquez did not show for his clinic appointment at [facility] on 08-12-13.

## 2013-10-04 ENCOUNTER — Emergency Department: Payer: Self-pay

## 2013-10-04 ENCOUNTER — Emergency Department
Admission: EM | Admit: 2013-10-04 | Discharge: 2013-10-04 | Disposition: A | Discharge status: Home / Self Care | Payer: Self-pay | Attending: Emergency Medicine | Admitting: Emergency Medicine

## 2013-10-04 DIAGNOSIS — M1990 Unspecified osteoarthritis, unspecified site: Secondary | ICD-10-CM | POA: Insufficient documentation

## 2013-10-04 DIAGNOSIS — M779 Enthesopathy, unspecified: Secondary | ICD-10-CM | POA: Insufficient documentation

## 2013-10-04 DIAGNOSIS — T07XXXA Unspecified multiple injuries, initial encounter: Secondary | ICD-10-CM | POA: Insufficient documentation

## 2013-10-04 DIAGNOSIS — I10 Essential (primary) hypertension: Secondary | ICD-10-CM | POA: Insufficient documentation

## 2013-10-04 DIAGNOSIS — F17200 Nicotine dependence, unspecified, uncomplicated: Secondary | ICD-10-CM | POA: Insufficient documentation

## 2013-10-04 DIAGNOSIS — M25579 Pain in unspecified ankle and joints of unspecified foot: Secondary | ICD-10-CM | POA: Insufficient documentation

## 2013-10-04 DIAGNOSIS — R079 Chest pain, unspecified: Secondary | ICD-10-CM | POA: Insufficient documentation

## 2013-10-04 DIAGNOSIS — T148XXA Other injury of unspecified body region, initial encounter: Secondary | ICD-10-CM

## 2013-10-04 DIAGNOSIS — W010XXA Fall on same level from slipping, tripping and stumbling without subsequent striking against object, initial encounter: Secondary | ICD-10-CM | POA: Insufficient documentation

## 2013-10-04 DIAGNOSIS — M79672 Pain in left foot: Secondary | ICD-10-CM

## 2013-10-04 DIAGNOSIS — W19XXXA Unspecified fall, initial encounter: Secondary | ICD-10-CM

## 2013-10-04 HISTORY — DX: Hyperlipidemia, unspecified: E78.5

## 2013-10-04 HISTORY — DX: Unspecified osteoarthritis, unspecified site: M19.90

## 2013-10-04 HISTORY — DX: Essential (primary) hypertension: I10

## 2013-10-04 MED ORDER — HYDROCODONE-ACETAMINOPHEN 5-325 MG PO TABS
1.0000 | ORAL_TABLET | Freq: Three times a day (TID) | ORAL | Status: DC | PRN
Start: 2013-10-04 — End: 2013-10-11

## 2013-10-04 MED ORDER — IBUPROFEN 600 MG PO TABS
ORAL_TABLET | ORAL | Status: AC
Start: 2013-10-04 — End: ?
  Filled 2013-10-04: qty 1

## 2013-10-04 MED ORDER — IBUPROFEN 600 MG PO TABS
600.0000 mg | ORAL_TABLET | Freq: Once | ORAL | Status: AC
Start: 2013-10-04 — End: 2013-10-04
  Administered 2013-10-04: 600 mg via ORAL

## 2013-10-04 NOTE — ED Provider Notes (Signed)
Physician/Midlevel provider first contact with patient: 10/04/13 1319         [facility] EMERGENCY DEPARTMENT History and Physical Exam      Patient Name: Gary Vazquez,[name]  Encounter Date:  10/04/2013  Attending Physician: Theora Master, MD  PCP: Christa See, MD  Patient DOB:  Apr 17, 1966  MRN:  [id number]  Room:  E55/E55-A      History of Presenting Illness       HPI/ROS limited by:   []  Dementia  []  Pt Unconscious  []  Post-ictal  []  Intoxicated  []  Confused   []  Acuity   Other:    HPI/ROS provided by:   []  Patient   []  Family   []  EMS   []  Friend   []  Caregiver   []  POA   Other:    HPI     48 y.o. male presents with HEENT in his right shoulder and right elbow after falling on the ice.  He does have a history of cardiac disease with a stent in the past and is supposedly on blood pressure and cholesterol medicine.  He still smokes half a pack a day.  He is brought in by his son who says that he's not sure he is taking his medication as well as the fact that he fell and has pain.  He denies any head injury or loss of consciousness.  He is not on Coumadin or Plavix.        Review of Systems     Review of Systems   Constitutional: Negative.    HENT: Negative.    Eyes: Negative.    Respiratory: Negative.    Cardiovascular: Negative.    Gastrointestinal: Negative.    Genitourinary: Negative.    Musculoskeletal: Negative.  Negative for back pain and gait problem.        Pain in shoulder and elbow today   Skin: Negative.    Neurological: Negative.    Hematological: Negative.    Psychiatric/Behavioral: Negative.           [x]  Except as marked above as positive, the remainder of the systems above were reviewed and found negative.      Allergies     Pt  has no known allergies.    Medications     Current Outpatient Rx   Name  Route  Sig  Dispense  Refill   . PRAVASTATIN SODIUM 40 MG PO TABS    Oral    Take 0.5 tablets (20 mg total) by mouth daily.    30 tablet    0     . ASPIRIN 81 MG PO CHEW    Oral    Chew 1 tablet (81 mg  total) by mouth daily.    30 tablet    0     . HYDROCODONE-ACETAMINOPHEN 5-325 MG PO TABS    Oral    Take 1 tablet by mouth every 8 (eight) hours as needed for Pain.    10 tablet    0     . LISINOPRIL 10 MG PO TABS    Oral    Take 10 mg by mouth daily.             Marland Kitchen LORAZEPAM 0.5 MG PO TABS    Oral    Take 1 tablet (0.5 mg total) by mouth nightly as needed for Anxiety.    20 tablet    0     . METOPROLOL SUCCINATE ER 25 MG PO TB24  Oral    Take 25 mg by mouth daily.             Marland Kitchen NAPROXEN 500 MG PO TABS    Oral    Take 1 tablet (500 mg total) by mouth 2 (two) times daily with meals.    30 tablet    0     . OXYCODONE-ACETAMINOPHEN 5-325 MG PO TABS    Oral    Take 1 tablet by mouth every 6 (six) hours as needed for Pain.    15 tablet    0          Past Medical History     Pt has a past medical history of HTN (hypertension); Hyperlipidemia; and Arthritis.    Past Surgical History     Pt has past surgical history that includes Appendectomy; Abdominal surgery; Appendectomy; and spleen surgery.    Family History     The family history includes Diabetes in his father; Heart attack in his father; Heart disease in his father; Hyperlipidemia in his father; and Hypertension in his father.    Social History     Pt reports that he has been smoking.  He has never used smokeless tobacco. He reports that he does not drink alcohol or use illicit drugs.    Physical Exam     Blood pressure 152/98, pulse 90, temperature 97.5 F (36.4 C), temperature source Oral, resp. rate 18, weight 100.5 kg, SpO2 100.00%.    Physical Exam   Nursing note and vitals reviewed.  Constitutional: He is oriented to person, place, and time. He appears well-developed and well-nourished. He is active and cooperative.        Looks older than stated age, holding his right arm   HENT:   Head: Normocephalic and atraumatic.   Right Ear: Hearing, tympanic membrane and ear canal normal.   Left Ear: Hearing, tympanic membrane and ear canal normal.   Nose: Nose  normal.   Mouth/Throat: Uvula is midline, oropharynx is clear and moist and mucous membranes are normal.   Eyes: Conjunctivae normal, EOM and lids are normal. Pupils are equal, round, and reactive to light.   Neck: Trachea normal, normal range of motion, full passive range of motion without pain and phonation normal. Neck supple.        nontender   Cardiovascular: Normal rate, regular rhythm, normal heart sounds and intact distal pulses.    Pulmonary/Chest: Effort normal and breath sounds normal.   Abdominal: Soft. Normal appearance and bowel sounds are normal.   Musculoskeletal: Normal range of motion. He exhibits tenderness.        Right ac joint, no swelling, and right elbow, no swelling full rom   Neurological: He is alert and oriented to person, place, and time. He has normal strength.   Skin: Skin is warm, dry and intact.   Psychiatric: He has a normal mood and affect. His behavior is normal.       Orders Placed     Orders Placed This Encounter   Procedures   . XR Elbow Right AP Lateral And Obliques   . XR Shoulder Right 2+ Views   . Ambulatory referral to [facility]   . Apply Ice Pack       Diagnostic Results       Labs  Results     ** No Results found for the last 24 hours. **          Radiologic Studies  Xr Elbow Right  Ap Lateral And Obliques    10/04/2013  Calcific density is seen about the lateral aspect of the elbow suggestive previous soft tissue injury. Mild enthesopathic changes about the medial humeral epicondyle and olecranon are present. No acute osseous findings demonstrated. No significant effusion demonstrated.  ReadingStation:[email or web address]     Xr Shoulder Right 2+ Views    10/04/2013  Mild degenerative changes about the acromioclavicular joint and glenohumeral joint are present. No acute fracture demonstrated.  ReadingStation:[email or web address]       EKG  []  Viewed by me []  Interpreted by me []  Abnormal: see below        [x]  Reviewed labs and radiology if applicable  [x]  Reviewed Old Records if  available        MDM / Critical Care   Blood pressure 152/98, pulse 90, temperature 97.5 F (36.4 C), temperature source Oral, resp. rate 18, weight 100.5 kg, SpO2 100.00%.    Pt here with bony contusion, noe vidence of fracture on xray.  Incidentally pt with recent stent, not followed by primary doctor, says he is taking his meds, son said he we'll monitor the medication and make sure he is taking it.  He was set up to see transition clinic by me on March 16.       Discussed with:    []  Admitting Attending  []  Family   []  Radiologist   []  PCP   []  Consultant     Procedures              Diagnosis / Disposition     Clinical Impression  1. Uncontrolled hypertension    2. Contusion    3. Fall, initial encounter    4. Hypertension, uncontrolled    5. Arthritis    6. Bone spur    7. Chest pain    8. Tobacco dependence    9. Heel pain, left        Disposition  ED Disposition     Discharge [name] discharge to home/self care.    Condition at disposition: Stable            Prescriptions  New Prescriptions    HYDROCODONE-ACETAMINOPHEN (NORCO) 5-325 MG PER TABLET    Take 1 tablet by mouth every 8 (eight) hours as needed for Pain.                                          Theora Master, MD  10/04/13 [phone]

## 2013-10-04 NOTE — ED Notes (Signed)
Ice pack to left elbow.  Pt declined pillow to prop arm

## 2013-10-04 NOTE — Progress Notes (Signed)
Recvd call that pnt need TC appointment. Appointment made for Monday, March 16th at 2:30. RN notified who communicated to pnt. Referral faxed.     Wilford Grist, RN, BSN  Case Management/Utilization Review Emergency Department  [phone]  Office Hours: Sunday-Friday 9 am-9pm

## 2013-10-04 NOTE — Discharge Instructions (Signed)
Contusion,Soft Tissue  You have a CONTUSION, which is a bruise with swelling and some bleeding under the skin. There are no broken bones. This injury takes a few days to a few weeks to heal.  Home Care:  1) Keep the injured part elevated to reduce pain and swelling. This is especially important during the first 48 hours.  2) Make an ice pack (ice cubes in a plastic bag, wrapped in a towel) and apply for 20 minutes every 1-2 hours the first day. Continue this 3-4 times a day until the pain and swelling goes away.  3) You may use acetaminophen (Tylenol) or ibuprofen (Motrin, Advil) to control pain, unless another pain medicine was prescribed. [ NOTE : If you have chronic liver or kidney disease or ever had a stomach ulcer or GI bleeding, talk with your doctor before using these medicines.]  Follow Up  with your doctor or this facility if you are not improving within the next THREE days.  [NOTE: If X-rays were taken, they will be reviewed by a radiologist. You will be notified of any new findings that may affect your care.]  Get Prompt Medical Attention  if any of the following occur:  -- Pain or swelling increases  -- Injured arm or leg becomes cold, blue, numb or tingly  -- Redness, warmth or drainage from the skin   [address], [place], [place] [address]. All rights reserved. This information is not intended as a substitute for professional medical care. Always follow your healthcare professional's instructions.      Controlling High Blood Pressure  High blood pressure (hypertension) is called the silent killer. This is because many people who have it don't know it. Normal blood pressure is less than 120/80. Know your blood pressure and remember to check it regularly. Doing so can save your life. Here are some things you can do to help control your blood pressure.    Choose heart-healthy foods   Select low-salt, low-fat foods.   Limit canned, dried, cured, packaged, and fast foods.  These can contain a lot of salt.   Eat 8-10 servings of fruits and vegetables every day.   Choose lean meats, fish, or chicken.   Eat whole-grain pasta, brown rice, and beans.   Eat 2-3 servings of low-fat or fat-free dairy products   Ask your doctor about the DASH eating plan. This plan helps reduce blood pressure.  Maintain a healthy weight   Ask your healthcare provider how many calories to eat a day. Then stick to that number.   Ask your healthcare provider what weight range is healthiest for you. If you are overweight, weight loss of only 10 lbs can help lower blood pressure.   Limit snacks and sweets.   Get regular exercise.  Get up and get active   Choose activities you enjoy. Find ones you can do with friends or family.   Park farther away from building entrances.   Use stairs instead of the elevator.   When you can, walk or bike instead of driving.   Rake leaves, garden, or do household repairs.   Be active for at least 30 minutes a day, most days of the week.  Manage stress   Make time to relax and enjoy life. Find time to laugh.   Visit with family and friends, and keep up with hobbies.  Limit alcohol and quit smoking   Men: Have no more than 2 drinks per day.   Women:  Have no more than 1 drink per day.   Talk with your healthcare provider about quitting smoking. Smoking increases your risk for heart disease and stroke. Ask about local or community programs that can help.  Medications  If lifestyle changes aren't enough, your healthcare provider may prescribe high blood pressure medicine. Take all medications as prescribed.    [address], [address], [place], [place] [address]. All rights reserved. This information is not intended as a substitute for professional medical care. Always follow your healthcare professional's instructions.

## 2013-10-11 ENCOUNTER — Encounter: Payer: Self-pay | Admitting: Family

## 2013-10-11 ENCOUNTER — Ambulatory Visit: Payer: Self-pay | Attending: Family | Admitting: Family

## 2013-10-11 VITALS — BP 152/83 | HR 74 | Resp 18 | Ht 74.0 in | Wt 217.9 lb

## 2013-10-11 DIAGNOSIS — E785 Hyperlipidemia, unspecified: Secondary | ICD-10-CM | POA: Insufficient documentation

## 2013-10-11 DIAGNOSIS — R7303 Prediabetes: Secondary | ICD-10-CM | POA: Insufficient documentation

## 2013-10-11 DIAGNOSIS — M2550 Pain in unspecified joint: Secondary | ICD-10-CM

## 2013-10-11 DIAGNOSIS — Z09 Encounter for follow-up examination after completed treatment for conditions other than malignant neoplasm: Secondary | ICD-10-CM | POA: Insufficient documentation

## 2013-10-11 DIAGNOSIS — F419 Anxiety disorder, unspecified: Secondary | ICD-10-CM

## 2013-10-11 DIAGNOSIS — R7309 Other abnormal glucose: Secondary | ICD-10-CM

## 2013-10-11 DIAGNOSIS — R079 Chest pain, unspecified: Secondary | ICD-10-CM | POA: Insufficient documentation

## 2013-10-11 DIAGNOSIS — I10 Essential (primary) hypertension: Secondary | ICD-10-CM

## 2013-10-11 DIAGNOSIS — F411 Generalized anxiety disorder: Secondary | ICD-10-CM

## 2013-10-11 MED ORDER — NAPROXEN 500 MG PO TABS
500.0000 mg | ORAL_TABLET | Freq: Two times a day (BID) | ORAL | Status: DC
Start: 2013-10-11 — End: 2014-02-23

## 2013-10-11 MED ORDER — HYDROXYZINE PAMOATE 25 MG PO CAPS
25.0000 mg | ORAL_CAPSULE | Freq: Three times a day (TID) | ORAL | Status: DC | PRN
Start: 2013-10-11 — End: 2014-02-23
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
## 2013-10-11 NOTE — Patient Instructions (Signed)
You need to secure an appointment with a Primary Care Provider (PCP) for your health care follow-up, routine health screenings, and possibly refills on prescriptions.  If you need assistance with this, our Nurse Navigator, Pension scheme manager, RN can assist you.  You can call the [facility] at [phone].     Please go to the [facility], and pay 10 dollars for not showing to your appointment there in January 2015.  This will allow them to re-evaluate your eligibility status.  If you are approved for services, then the [facility] will be your Primary Care Provider.   The phone number to the [facility] is: [phone].    You can start taking Vistaril 25mg  by mouth, every eight hours, as needed for feelings of anxiety.  You may also call [facility] for an intake appointment.   Their phone number is: [phone].    You have a follow up appointment scheduled at the [facility] on Monday, October 25, 2013 at 2:30pm.   The phone number to the [facility] is: [phone].  Please bring all of your medication bottles, so that we have an accurate update of the medications that you are taking.      If you are able to obtain blood pressure readings outside of the office, please write these down, and bring to your follow up appointments.     Please review the following:      Understanding Anxiety Disorders  Almost everyone gets nervous now and then. It's normal to have knots in your stomach before a test, or for your heart to race on a first date. But an anxiety disorder is much more than a case of nerves. In fact, its symptoms may be overwhelming. But treatment can relieve many of these symptoms. Talking to your doctor is the first step.    What Are Anxiety Disorders?  An anxiety disorder causes intense feelings of panic and fear. These feelings may arise for no apparent reason. And they tend to recur again and again. They may prevent you from coping with  life and cause you great distress. As a result, you may avoid anything that triggers your fear. In extreme cases, you may never leave the house. Anxiety disorders may cause other symptoms, such as:   Obsessive thoughts you can't control   Constant nightmares or painful thoughts of the past   Nausea, sweating, and muscle tension   Difficulty sleeping or concentrating  What Causes Anxiety Disorders?  Anxiety disorders tend to run in families. For some people, childhood abuse or neglect may play a role. For others, stressful life events or trauma may trigger anxiety disorders. Anxiety can trigger low self-esteem and poor coping skills.  Common Anxiety Disorders   Panic disorder: This causes an intense fear of being in danger.   Phobias: These are extreme fears of certain objects, places, or events.   Obsessive-compulsive disorder: This causes you to have unwanted thoughts. You also may perform certain actions over and over.   Posttraumatic stress disorder: This occurs in people who have survived a terrible ordeal. It can cause nightmares and flashbacks about the event.   Generalized anxiety disorder: This causes constant worry that can greatly disrupt your life.   Getting Better  You may believe that nothing can help you. Or, you might fear what others may think. But most anxiety symptoms can be eased. Having an anxiety disorder is nothing to be ashamed of. Most people do best with  treatment that combines medication and therapy. Although these aren't cures, they can help you live a healthier life.   [address], [place], [place] [address]. All rights reserved. This information is not intended as a substitute for professional medical care. Always follow your healthcare professional's instructions.          Prediabetes  You have been diagnosed with prediabetes. This means that the level of sugar (glucose) in your blood is too high. If you have prediabetes, you are at risk for  developing type 2 diabetes. Type 2 diabetes is diagnosed when the level of glucose in the blood reaches a certain high level. With prediabetes, it hasn't yet reached this point. But it is higher than normal. It is vital to make lifestyle changes to lower your blood sugar, improve your health, and prevent diabetes. This sheet will tell you more.        Why Worry About Prediabetes?  Diabetes is a disease where the body's cells have trouble using glucose in the blood for energy. With prediabetes, the cells have started to have trouble using glucose. As a result, too much glucose stays in the blood. This can affect how your heart and blood vessels work. And, without changes in diet and lifestyle, the problem can get worse. Type 2 diabetes can develop. Once you have type 2 diabetes, it is chronic (ongoing) and needs to be managed for the rest of your life. Diabetes can harm the body and your health. It can damage organs, such as your eyes and kidneys. It makes you more likely to have heart disease. And it can damage nerves and blood vessels.  Risk Factors For Prediabetes  The exact cause of prediabetes is not clear. But certain risk factors make a person more likely to have it. These include:   A family history of type 2 diabetes   Being overweight   Being over age 53   Having had gestational diabetes   Not being physically active   Being [place] American, Asian-American, Hispanic, Native American, or Pacific Islander  Diagnosing Prediabetes  Prediabetes has no symptoms. The only way to find it is with a blood test. You may have had one or both of these blood tests:   Fasting glucose test. Blood is taken and tested after you have fasted (not eaten) for 8 hours. A normal test result is 99 milligrams per deciliter (mg/dL) or lower. Prediabetes is 100-125 mg/dL. Diabetes is 126 mg/dL and higher.   Glucose tolerance test. Your blood sugar is measured before and after you drink a very sugary liquid. A normal test  result is 139 milligrams per deciliter (mg/dL) or lower. Prediabetes is 140-199 mg/dL. Diabetes is 200 mg/dL and higher.  Treating Prediabetes  The best way to treat prediabetes is to lose extra weight and be more physically active. These changes help the body's cells use blood sugar better. Even a small amount of weight loss can help. Work with your healthcare provider to make a plan to eat well and be more active. Keep in mind that small changes can add up. Other changes in your lifestyle may make you less likely to develop diabetes. Your healthcare provider can talk with you about these.  Follow-Up  If it is untreated, prediabetes can turn into diabetes. This is a serious health condition. Take steps to stop this from happening. Follow the treatment plan you have been given. You may have your blood glucose tested again in about 12-18 months.  Symptoms of Diabetes  Most people do not have symptoms. But let your healthcare provider know if you have any of the following:   Always feel very tired   Feel very thirsty or hungry much of the time   Have to urinate often   Lose weight for no reason   Feel numbness or tingling in your fingers or toes   Have cuts or bruises that don't seem to heal   Have blurry vision    [address] [place], [place] [address]. All rights reserved. This information is not intended as a substitute for professional medical care. Always follow your healthcare professional's instructions.    High Blood Pressure --Established    High Blood Pressure (Hypertension) is a chronic disease. The cause is unknown in most cases. It can usually be controlled with lifestyle changes and/or medicines. Symptoms of high blood pressure may include headache, dizziness, visual changes, chest pain and shortness of breath. Sometimes it causes no symptoms at all. However, even if there are no symptoms, untreated high blood pressure increases the risk of heart attack, also known  as acute myocardial infarction, or AMI, and stroke. It is a serious health risk and should not be ignored.  A normal blood pressure is 120/80 or less. The first (top) number is the "systolic" pressure. The second (bottom) number is the "diastolic" pressure. Hypertension exists when either the top number is 140 or higher, OR the bottom number is 90 or higher on repeated measurements.  Home Care:  All patients with high blood pressure should do the following to lower their pressure. If you are on medicines, then these methods may reduce or eliminate your need for medicines in the future.  1. Begin a weight loss program if you are overweight.  2. Reduce your salt intake.   Avoid high salt foods (olives, pickles, smoked meats, salted potato chips, etc.).   Do not add salt to your food at the table.   Use only small amounts of salt when cooking.  3. Begin an exercise program. Discuss with your doctor what type of exercise program would be best for you. It doesn't have to be difficult. Even brisk walking for 20 minutes three times a week is a good form of exercise.  4. Avoid medicines which contain heart stimulants. This includes many cold and sinus decongestant pills and sprays as well as diet pills. Check the warnings about hypertension on the label. Stimulants such as amphetamine or cocaine could be lethal for someone with hypertension. Never take these.  5. Limit your caffeine intake or switch to caffeine-free products.  6. Stop smoking. If you are a long-time smoker, this can be hard. Enroll in a stop-smoking program to improve your chance of success.  7. Learning how to handle stress better is an important part of any program to lower blood pressure. Learn about relaxation methods such as meditation, yoga or biofeedback.  8. If medicines were prescribed, take them exactly as directed. Missing doses may cause your blood pressure get out of control.  9. Consider buying an automatic blood pressure machine (available  at most pharmacies). Use this to monitor your blood pressure at home and report the results to your doctor.  Follow Up:  Regular visits to your own physician for blood pressure checks and medicine adjustment is an important part of your care. Make a follow-up appointment as directed by our staff.  Get Prompt Medical Attention  if any of the following occur:  Chest pain or shortness of breath   Severe headache   Throbbing or rushing sound in the ears   Nosebleed   Sudden severe abdominal pain   Extreme drowsiness, confusion or fainting   Dizziness or vertigo (dizziness with spinning sensation)   Weakness of an arm or leg or one side of the face   Difficulty with speech or vision   [address], [address] [place], [place] [address]. All rights reserved. This information is not intended as a substitute for professional medical care. Always follow your healthcare professional's instructions.

## 2013-10-18 ENCOUNTER — Telehealth: Payer: Self-pay | Admitting: Family

## 2013-10-18 NOTE — Telephone Encounter (Signed)
Phone contact with patient to remind him to get fasting lab work done prior to [facility] appointment [facility] at 2:30pm.   [facility] and [facility] hours.  He reports he will get the lab work done prior to 10-25-13.   Reports he is feeling better since starting the Vistaril.     Denies any questions or concerns.  Ensured that he has phone number to [facility], if questions arise.

## 2013-10-25 ENCOUNTER — Ambulatory Visit: Payer: Self-pay | Admitting: Family

## 2013-10-27 ENCOUNTER — Telehealth: Payer: Self-pay

## 2013-10-27 NOTE — Telephone Encounter (Signed)
Mr Eidson did not show for his follow up appointment called to remind him to see medical care Spoke with his wife  She state she will let him know. Also informed her that he is eligible for the [facility] and he will need to pay a $10 no show fee  Prior to be seen there

## 2014-01-07 ENCOUNTER — Emergency Department: Payer: Self-pay

## 2014-01-07 ENCOUNTER — Emergency Department
Admission: EM | Admit: 2014-01-07 | Discharge: 2014-01-07 | Disposition: A | Discharge status: Home / Self Care | Payer: Self-pay | Attending: Emergency Medicine | Admitting: Emergency Medicine

## 2014-01-07 DIAGNOSIS — M779 Enthesopathy, unspecified: Secondary | ICD-10-CM | POA: Insufficient documentation

## 2014-01-07 DIAGNOSIS — M79609 Pain in unspecified limb: Secondary | ICD-10-CM | POA: Insufficient documentation

## 2014-01-07 DIAGNOSIS — M722 Plantar fascial fibromatosis: Secondary | ICD-10-CM | POA: Insufficient documentation

## 2014-01-07 DIAGNOSIS — X58XXXA Exposure to other specified factors, initial encounter: Secondary | ICD-10-CM | POA: Insufficient documentation

## 2014-01-07 DIAGNOSIS — M79672 Pain in left foot: Secondary | ICD-10-CM

## 2014-01-07 DIAGNOSIS — F17200 Nicotine dependence, unspecified, uncomplicated: Secondary | ICD-10-CM | POA: Insufficient documentation

## 2014-01-07 DIAGNOSIS — I10 Essential (primary) hypertension: Secondary | ICD-10-CM | POA: Insufficient documentation

## 2014-01-07 MED ORDER — HYDROCODONE-ACETAMINOPHEN 5-325 MG PO TABS
ORAL_TABLET | ORAL | Status: AC
Start: 2014-01-07 — End: ?
  Filled 2014-01-07: qty 1

## 2014-01-07 MED ORDER — HYDROCODONE-ACETAMINOPHEN 5-325 MG PO TABS
1.0000 | ORAL_TABLET | Freq: Once | ORAL | Status: AC
Start: 2014-01-07 — End: 2014-01-07
  Administered 2014-01-07: 1 via ORAL

## 2014-01-07 MED ORDER — MELOXICAM 7.5 MG PO TABS
7.5000 mg | ORAL_TABLET | Freq: Every day | ORAL | Status: DC | PRN
Start: 2014-01-07 — End: 2014-02-23

## 2014-01-07 NOTE — Discharge Instructions (Signed)
Understanding Heel Pain  Your heel is the back part of your foot. A band of tissue called the plantar fascia connects the heel bone to the bones in the ball of your foot. Nerves run from the heel up the inside of your ankle and into your leg. When you feel pain in the bottom of your heel, the plantar fascia is most likely inflamed. Overuse or excess body weight can cause the tissue to tear or pull away from the bone. Sometimes the inflamed plantar fascia also irritates a nerve, causing more pain.    What Causes Heel Pain?  Wearing shoes with poor cushioning can irritate the tissue in your heel (plantar fascia). Being overweight or standing for long periods can also irritate the tissue. Running, walking, tennis, and other sports that put stress on the heels can cause tiny tears in the tissue. If your lower leg muscles are tight, this is more likely to occur. A tight Achilles tendon can also contribute to heel pain.  Symptoms  You may feel pain on the bottom or on the inside edge of your heel. The pain may be sharp when you get out of bed or when you stand up after sitting for a while. You may feel a dull ache in your heel after you've been standing for a long time on a hard surface. Running can also cause a dull ache. If a nerve is irritated, you may feel burning or a shooting pain in your heel.  Preventing Future Problems  To prevent future heel pain, wear shoes with well-cushioned heels. And do exercises prescribed by your doctor to stretch the plantar fascia and the muscles in the lower leg.    [address] [place], [place] [address]. All rights reserved. This information is not intended as a substitute for professional medical care. Always follow your healthcare professional's instructions.        What Is Plantar Fasciitis?   The plantar fascia is a ligament-like band running from your heel to the ball of your foot. This band pulls on the heel bone, raising the arch of your foot as  it pushes off the ground. But if your foot moves incorrectly, the plantar fascia may become strained. The fascia may swell and its tiny fibers may begin to fray, causing plantar fasciitis.  Causes  Plantar fasciitis is often caused by poor foot mechanics. If your foot flattens too much, the fascia may overstretch and swell. If your foot flattens too little, the fascia may ache from being pulled too tight.     Foot flattens too much        Foot flattens too little   Symptoms  With plantar fasciitis, the bottom of your foot may hurt when you stand, especially first thing in the morning. Pain usually occurs on the inside of the foot, near the spot where your heel and arch meet. Pain may lessen after a few steps, but it comes back after rest or with prolonged movement.  Related Problems  A heel spur is extra bone that may grow near the spot where the plantar fascia attaches to the heel. The heel spur may form in response to the plantar fascia's tug on the heel bone.  Bursitis is the swelling of a bursa, a fluid-filled sac that reduces friction between a ligament and a bone. Bursitis may develop if a swollen plantar fascia presses against a plantar bursa.   [address], [address], [place], [place] [address].  All rights reserved. This information is not intended as a substitute for professional medical care. Always follow your healthcare professional's instructions.        Treating Plantar Fasciitis    First, your doctor relieves pain. Then, the cause of your problem may be found and corrected. If your pain is due to poor foot mechanics, custom-made shoe inserts (orthoses) may help.    Reduce Symptoms   To relieve mild symptoms, try aspirin, ibuprofen, or other medications as directed. Rubbing ice on the affected area may also help.   To reduce severe pain and swelling, your doctor may prescribe pills or injections. Physical therapy, such as ultra sound or stretching exercises, may also be  recommended.   To reduce symptoms caused by poor foot mechanics, your foot may be taped. This supports the arch and temporarily controls movement. Night splints may also help by stretching the fascia.    Control Movement  If taping helps, your doctor may prescribe orthoses. Built from plaster casts of your feet, these inserts control the way your foot moves. As a result, your symptoms should go away.  If Surgery Is Needed  Your doctor may consider surgery if other types of treatment don't control your pain. During surgery, the plantar fascia is partially cut to release tension. As you heal, fibrous tissue fills the space between the heel bone and the plantar fascia.   Reduce Overuse  Every time your foot strikes the ground, the plantar fascia is stretched. You can reduce the strain on the plantar fascia and the possibility of overuse by following these suggestions:   Lose any excess weight.   Avoid running on hard or uneven ground.   Use orthoses at all times in your shoes and house slippers.   [address], [place], [place] [address]. All rights reserved. This information is not intended as a substitute for professional medical care. Always follow your healthcare professional's instructions.

## 2014-01-07 NOTE — ED Notes (Signed)
Pt c.o of pain to left foot from heel to toes, denies current trauma , says that he thinks that he has re aggravated an old injury, psm intact to foot, pain with palpation of left heel/sole of foot

## 2014-01-07 NOTE — ED Provider Notes (Signed)
[facility]  EMERGENCY DEPARTMENT  History and Physical Exam       Patient Name: Gary Vazquez,Gary Vazquez.  Encounter Date:  01/07/2014  Treating Provider: Arlice Colt, PA-C  Supervising Physician: Wynona Neat, DO  PCP: Christa See, MD  Patient DOB:  September 28, 1965  MRN:  [id number]  Room:  E59/E59-A      History of Presenting Illness     Chief complaint: Blister      Gary Vazquez. is a 48 y.o. male who presents with left heel pain for the past week. States that he fell off a ladder several years ago and landed on that heel. Had xrays that were normal with the exception of heel spurs.  He has been wearing shoe inserts.  Denies any known recent trauma.  States that it has been painful to bear weight on the left and has been walking on the ball of his foot.  Does not have PCP.  Denies any fever, chills, numbness, weakness, rashes or other joint problems.       Review of Systems     Review of Systems   Constitutional: Negative for fever and chills.   Eyes: Negative for blurred vision and double vision.   Respiratory: Negative for cough and shortness of breath.    Cardiovascular: Negative for chest pain.   Gastrointestinal: Negative for abdominal pain.   Musculoskeletal: Positive for joint pain. Negative for myalgias.   Skin: Negative for rash.   Neurological: Negative for dizziness, sensory change, focal weakness, loss of consciousness and headaches.   All other systems reviewed and are negative.           Allergies & Medications     Pt has No Known Allergies.    Discharge Medication List as of 01/07/2014  2:05 PM      CONTINUE these medications which have NOT CHANGED    Details   aspirin 81 MG chewable tablet Chew 1 tablet (81 mg total) by mouth daily., Starting 07/20/2013, Until Discontinued, Print      hydrOXYzine (VISTARIL) 25 MG capsule Take 1 capsule (25 mg total) by mouth 3 (three) times daily as needed for Anxiety., Starting 10/11/2013, Until Discontinued, Print      lisinopril  (PRINIVIL,ZESTRIL) 10 MG tablet Take 10 mg by mouth daily., Until Discontinued, Historical Med      metoprolol XL (TOPROL-XL) 25 MG 24 hr tablet Take 25 mg by mouth daily., Until Discontinued, Historical Med      naproxen (NAPROSYN) 500 MG tablet Take 1 tablet (500 mg total) by mouth 2 (two) times daily with meals., Starting 10/11/2013, Until Discontinued, Normal      pravastatin (PRAVACHOL) 40 MG tablet Take 0.5 tablets (20 mg total) by mouth daily., Starting 07/20/2013, Until Discontinued, Print              Past Medical History     Pt has a past medical history of HTN (hypertension); Hyperlipidemia; and Arthritis.     Past Surgical History     Pt  has past surgical history that includes Appendectomy; Abdominal surgery; Appendectomy; and spleen surgery.     Family History     The family history includes Diabetes in his father; Heart attack in his father; Heart disease in his father; Hyperlipidemia in his father; Hypertension in his father.     Social History     Pt reports that he has been smoking.  He has never used smokeless tobacco. He reports that he uses illicit drugs (  Marijuana and Other-see comments). He reports that he does not drink alcohol.     Physical Exam     Blood pressure 153/96, pulse 100, temperature 97.9 F (36.6 C), resp. rate 18, height 1.88 m, weight 95.573 kg, SpO2 100 %.      Constitutional: Well developed, well nourished, active, in no apparent distress.  HEENT:  NCAT.  No oropharyngeal lesions, moist mucous membranes.  Eyes: PERRL. EOMI.  No conjunctivitis, no discharge.  Visual acuity grossly intact.  Pulmonary/Chest: Effort normal with symmetric expansion and no accessory muscle use. Lungs clear to auscultation throughout.   Cardiovascular: RRR, no MRG.  No JVD or significant peripheral edema.   Musculoskeletal: Not bearing weight on left.  TTP left heel diffusely and plantar arch. NVI distally.   Neurological: Pt is alert. CN II-XII grossly intact. Moving all extremities without  apparent deficit.   Psychiatric: Affect is appropriate. There is no agitation.   Skin: Skin is warm, dry, well perfused. No rash or lesions noted. No cyanosis, jaundice or pallor.        Diagnostic Results     The results of the diagnostic studies below have been reviewed by myself:    Labs  Results    ** No results found for the last 24 hours. **          Radiologic Studies  Xr Foot Left Ap Lateral And Oblique    01/07/2014   No acute abnormality.  ReadingStation:[id number]           ED Course and Medical Decision Making     ED Medication Orders    Start     Status Ordering Provider    01/07/14 1319  HYDROcodone-acetaminophen (NORCO) 5-325 MG per tablet 1 tablet   Once in ED     Route: Oral  Ordered Dose: 1 tablet     Last MAR action:  Given Ravynn Hogate L          This patient presents to the Emergency Department following trauma and sustained in injury to an extremity.   Evaluation and treatment for this patient was performed and revealed that there were no serious injuries identified in the ED, and no threat of loss of limb.  Sequelae of their injury were considered in the differential diagnosis including sprain, fracture, dislocation, head/spine injury, contusion, abrasion, and laceration. Any serious sequelae that would require admission and immediate surgical repair were thought unlikely, and the patient is stable for discharge home.  The diagnostic impression and plan and appropriate follow-up were discussed and agreed upon with the patient and/or family.  If performed the results of lab/radiology tests were reviewed and discussed, wrap was applied, and crutch training performed.  All questions were answered and concerns addressed.  Fall/trauma/sports precautions have been given and the patient was warned to return immediately for worsening symptoms or any acute concerns and to follow up with an orthopaedic specialist within an appropriate time as discussed.      In addition to the above history, please see  nursing notes. Allergies, meds, past medical, family, social hx, and the results of the diagnostic studies performed have been reviewed by myself.    This chart was generated by an EMR and may contain errors or omissions not intended by the user.     Procedures / Critical Care     None     Diagnosis / Disposition     Clinical Impression  1. Heel pain, left    2. Bone spur  3. Plantar fasciitis        Disposition  ED Disposition    Discharge [name] discharge to home/self care.    Condition at disposition: Stable              Follow up for Discharged Patients  [facility]  [address]  [place] [address]  [phone]  Schedule an appointment as soon as possible for a visit      [facility] Emergency Department  [address]  [place] [address]  [phone]          Prescriptions for Discharged Patients  Discharge Medication List as of 01/07/2014  2:05 PM      START taking these medications    Details   meloxicam (MOBIC) 7.5 MG tablet Take 1-2 tablets (7.5-15 mg total) by mouth daily as needed for Pain., Starting 01/07/2014, Until Discontinued, Print                      Lynnae Prude, PA  01/07/14 1500    Carlena Sax T, DO  01/07/14 1508

## 2014-02-23 ENCOUNTER — Emergency Department: Payer: Self-pay

## 2014-02-23 ENCOUNTER — Observation Stay: Payer: Self-pay | Admitting: Internal Medicine

## 2014-02-23 ENCOUNTER — Observation Stay: Payer: Self-pay

## 2014-02-23 ENCOUNTER — Observation Stay
Admission: EM | Admit: 2014-02-23 | Discharge: 2014-02-24 | Disposition: A | Discharge status: Home / Self Care | Payer: Self-pay | Attending: Internal Medicine | Admitting: Internal Medicine

## 2014-02-23 DIAGNOSIS — R7309 Other abnormal glucose: Secondary | ICD-10-CM | POA: Insufficient documentation

## 2014-02-23 DIAGNOSIS — R1031 Right lower quadrant pain: Secondary | ICD-10-CM

## 2014-02-23 DIAGNOSIS — M129 Arthropathy, unspecified: Secondary | ICD-10-CM | POA: Insufficient documentation

## 2014-02-23 DIAGNOSIS — T1590XA Foreign body on external eye, part unspecified, unspecified eye, initial encounter: Secondary | ICD-10-CM

## 2014-02-23 DIAGNOSIS — D72829 Elevated white blood cell count, unspecified: Secondary | ICD-10-CM | POA: Insufficient documentation

## 2014-02-23 DIAGNOSIS — S32409A Unspecified fracture of unspecified acetabulum, initial encounter for closed fracture: Principal | ICD-10-CM | POA: Insufficient documentation

## 2014-02-23 DIAGNOSIS — Z9089 Acquired absence of other organs: Secondary | ICD-10-CM | POA: Insufficient documentation

## 2014-02-23 DIAGNOSIS — Y998 Other external cause status: Secondary | ICD-10-CM | POA: Insufficient documentation

## 2014-02-23 DIAGNOSIS — K8020 Calculus of gallbladder without cholecystitis without obstruction: Secondary | ICD-10-CM | POA: Insufficient documentation

## 2014-02-23 DIAGNOSIS — E7800 Pure hypercholesterolemia, unspecified: Secondary | ICD-10-CM | POA: Insufficient documentation

## 2014-02-23 DIAGNOSIS — M898X9 Other specified disorders of bone, unspecified site: Secondary | ICD-10-CM | POA: Insufficient documentation

## 2014-02-23 DIAGNOSIS — Z135 Encounter for screening for eye and ear disorders: Secondary | ICD-10-CM | POA: Insufficient documentation

## 2014-02-23 DIAGNOSIS — R112 Nausea with vomiting, unspecified: Secondary | ICD-10-CM | POA: Insufficient documentation

## 2014-02-23 DIAGNOSIS — K2970 Gastritis, unspecified, without bleeding: Secondary | ICD-10-CM | POA: Insufficient documentation

## 2014-02-23 DIAGNOSIS — W11XXXA Fall on and from ladder, initial encounter: Secondary | ICD-10-CM | POA: Insufficient documentation

## 2014-02-23 DIAGNOSIS — F411 Generalized anxiety disorder: Secondary | ICD-10-CM | POA: Insufficient documentation

## 2014-02-23 DIAGNOSIS — Y92009 Unspecified place in unspecified non-institutional (private) residence as the place of occurrence of the external cause: Secondary | ICD-10-CM | POA: Insufficient documentation

## 2014-02-23 DIAGNOSIS — Y9389 Activity, other specified: Secondary | ICD-10-CM | POA: Insufficient documentation

## 2014-02-23 DIAGNOSIS — I10 Essential (primary) hypertension: Secondary | ICD-10-CM | POA: Insufficient documentation

## 2014-02-23 DIAGNOSIS — M79609 Pain in unspecified limb: Secondary | ICD-10-CM | POA: Insufficient documentation

## 2014-02-23 DIAGNOSIS — R109 Unspecified abdominal pain: Secondary | ICD-10-CM | POA: Diagnosis present

## 2014-02-23 DIAGNOSIS — K2990 Gastroduodenitis, unspecified, without bleeding: Secondary | ICD-10-CM | POA: Insufficient documentation

## 2014-02-23 DIAGNOSIS — Z7982 Long term (current) use of aspirin: Secondary | ICD-10-CM | POA: Insufficient documentation

## 2014-02-23 DIAGNOSIS — F17200 Nicotine dependence, unspecified, uncomplicated: Secondary | ICD-10-CM | POA: Insufficient documentation

## 2014-02-23 DIAGNOSIS — E785 Hyperlipidemia, unspecified: Secondary | ICD-10-CM | POA: Insufficient documentation

## 2014-02-23 LAB — URINALYSIS WITH CULTURE REFLEX
Bilirubin, UA: NEGATIVE
Blood, UA: NEGATIVE
Glucose, UA: NEGATIVE mg/dL
Ketones UA: NEGATIVE mg/dL
Leukocyte Esterase, UA: NEGATIVE Leu/uL
Nitrite, UA: NEGATIVE
Protein, UR: NEGATIVE mg/dL
RBC, UA: 2 /hpf (ref 0–4)
Urine Specific Gravity: 1.045 — ABNORMAL HIGH (ref 1.001–1.040)
Urobilinogen, UA: NORMAL mg/dL
WBC, UA: 1 /hpf (ref 0–4)
pH, Urine: 7 pH (ref 5.0–8.0)

## 2014-02-23 LAB — CBC AND DIFFERENTIAL
Basophils %: 0.6 % (ref 0.0–3.0)
Basophils Absolute: 0.1 10*3/uL (ref 0.0–0.3)
Eosinophils %: 0.9 % (ref 0.0–7.0)
Eosinophils Absolute: 0.1 10*3/uL (ref 0.0–0.8)
Hematocrit: 50.8 % (ref 39.0–52.5)
Hemoglobin: 17.7 gm/dL — ABNORMAL HIGH (ref 13.0–17.5)
Lymphocytes Absolute: 1.8 10*3/uL (ref 0.6–5.1)
Lymphocytes: 11.6 % — ABNORMAL LOW (ref 15.0–46.0)
MCH: 32 pg (ref 28–35)
MCHC: 35 gm/dL (ref 32–36)
MCV: 93 fL (ref 80–100)
MPV: 8.1 fL (ref 6.0–10.0)
Monocytes Absolute: 0.8 10*3/uL (ref 0.1–1.7)
Monocytes: 5.1 % (ref 3.0–15.0)
Neutrophils %: 81.8 % — ABNORMAL HIGH (ref 42.0–78.0)
Neutrophils Absolute: 12.8 10*3/uL — ABNORMAL HIGH (ref 1.7–8.6)
PLT CT: 253 10*3/uL (ref 130–440)
RBC: 5.47 10*6/uL (ref 4.00–5.70)
RDW: 11.8 % (ref 11.0–14.0)
WBC: 15.6 10*3/uL — ABNORMAL HIGH (ref 4.0–11.0)

## 2014-02-23 LAB — ECG 12-LEAD
P Wave Axis: 24 deg
P Wave Duration: 112 ms
P-R Interval: 146 ms
Patient Age: 48 years
Q-T Dispersion: 48 ms
Q-T Interval(Corrected): 400 ms
Q-T Interval: 400 ms
QRS Axis: 31 deg
QRS Duration: 104 ms
T Axis: 56 deg
Ventricular Rate: 60 /min

## 2014-02-23 LAB — BASIC METABOLIC PANEL
Anion Gap: 18.7 mMol/L — ABNORMAL HIGH (ref 7.0–18.0)
BUN / Creatinine Ratio: 14.9 Ratio (ref 10.0–30.0)
BUN: 15 mg/dL (ref 7–22)
CO2: 21.1 mMol/L (ref 20.0–30.0)
Calcium: 9.8 mg/dL (ref 8.5–10.5)
Chloride: 103 mMol/L (ref 98–110)
Creatinine: 1.01 mg/dL (ref 0.80–1.30)
EGFR: >60 mL/min/{1.73_m2}
Glucose: 124 mg/dL — ABNORMAL HIGH (ref 70–99)
Osmolality Calc: 280 mOsm/kg (ref 275–300)
Potassium: 3.8 mMol/L (ref 3.5–5.3)
Sodium: 139 mMol/L (ref 136–147)

## 2014-02-23 LAB — HEPATIC FUNCTION PANEL
ALT: 30 U/L (ref 0–55)
AST (SGOT): 17 U/L (ref 10–42)
Albumin/Globulin Ratio: 1.31 Ratio (ref 0.70–1.50)
Albumin: 4.2 gm/dL (ref 3.5–5.0)
Alkaline Phosphatase: 66 U/L (ref 40–145)
Bilirubin Direct: 0.1 mg/dL (ref 0.0–0.3)
Bilirubin, Total: 0.4 mg/dL (ref 0.1–1.2)
Globulin: 3.2 gm/dL (ref 2.0–4.0)
Protein, Total: 7.4 gm/dL (ref 6.0–8.3)

## 2014-02-23 LAB — VH CARDIAC PROF.WITH TROPONIN
Creatine Kinase (CK): 96 U/L (ref 30–230)
Creatinine Kinase MB (CKMB): 1.5 ng/mL (ref 0.1–6.0)
Troponin I: 0.01 ng/mL (ref 0.00–0.02)

## 2014-02-23 LAB — LIPASE: Lipase: 104 U/L — ABNORMAL HIGH (ref 8–78)

## 2014-02-23 MED ORDER — VH HYDROMORPHONE HCL PF 1 MG/ML CARPUJECT
1.0000 mg | Freq: Once | INTRAMUSCULAR | Status: AC
Start: 2014-02-23 — End: 2014-02-23
  Administered 2014-02-23: 1 mg via INTRAVENOUS

## 2014-02-23 MED ORDER — ACETAMINOPHEN 325 MG PO TABS
650.0000 mg | ORAL_TABLET | ORAL | Status: DC | PRN
Start: 2014-02-23 — End: 2014-02-24

## 2014-02-23 MED ORDER — METOPROLOL SUCCINATE ER 25 MG PO TB24
25.0000 mg | ORAL_TABLET | Freq: Every day | ORAL | Status: DC
Start: 2014-02-23 — End: 2014-02-24
  Administered 2014-02-23 – 2014-02-24 (×2): 25 mg via ORAL
  Filled 2014-02-23 (×2): qty 1

## 2014-02-23 MED ORDER — HYDRALAZINE HCL 20 MG/ML IJ SOLN
10.0000 mg | Freq: Four times a day (QID) | INTRAMUSCULAR | Status: DC | PRN
Start: 2014-02-23 — End: 2014-02-24

## 2014-02-23 MED ORDER — ASPIRIN 81 MG PO CHEW
81.0000 mg | CHEWABLE_TABLET | Freq: Every day | ORAL | Status: DC
Start: 2014-02-23 — End: 2014-02-24
  Administered 2014-02-23 – 2014-02-24 (×2): 81 mg via ORAL
  Filled 2014-02-23 (×2): qty 1

## 2014-02-23 MED ORDER — NICOTINE 21 MG/24HR TD PT24
1.0000 | MEDICATED_PATCH | Freq: Every day | TRANSDERMAL | Status: DC
Start: 2014-02-23 — End: 2014-02-24
  Filled 2014-02-23: qty 1

## 2014-02-23 MED ORDER — IOHEXOL 240 MG/ML IJ SOLN
50.0000 mL | Freq: Once | INTRAMUSCULAR | Status: AC
Start: 2014-02-23 — End: 2014-02-23
  Administered 2014-02-23: 50 mL via ORAL

## 2014-02-23 MED ORDER — VH HYDROMORPHONE HCL 1 MG/ML (NARRATOR)
INTRAMUSCULAR | Status: AC
Start: 2014-02-23 — End: ?
  Filled 2014-02-23: qty 1

## 2014-02-23 MED ORDER — ONDANSETRON HCL 4 MG/2ML IJ SOLN
4.0000 mg | Freq: Once | INTRAMUSCULAR | Status: AC
Start: 2014-02-23 — End: 2014-02-23
  Administered 2014-02-23: 4 mg via INTRAVENOUS

## 2014-02-23 MED ORDER — LISINOPRIL 10 MG PO TABS
10.0000 mg | ORAL_TABLET | Freq: Every day | ORAL | Status: DC
Start: 2014-02-23 — End: 2014-02-24
  Administered 2014-02-23 – 2014-02-24 (×2): 10 mg via ORAL
  Filled 2014-02-23 (×2): qty 1

## 2014-02-23 MED ORDER — HYDROXYZINE PAMOATE 25 MG PO CAPS
25.0000 mg | ORAL_CAPSULE | Freq: Three times a day (TID) | ORAL | Status: DC | PRN
Start: 2014-02-23 — End: 2014-02-24
  Filled 2014-02-23: qty 1

## 2014-02-23 MED ORDER — CYCLOBENZAPRINE HCL 10 MG PO TABS
10.0000 mg | ORAL_TABLET | Freq: Three times a day (TID) | ORAL | Status: DC | PRN
Start: 2014-02-23 — End: 2014-02-24

## 2014-02-23 MED ORDER — ENOXAPARIN SODIUM 40 MG/0.4ML SC SOLN
40.0000 mg | Freq: Every day | SUBCUTANEOUS | Status: DC
Start: 2014-02-23 — End: 2014-02-24
  Filled 2014-02-23 (×2): qty 0.4

## 2014-02-23 MED ORDER — ONDANSETRON HCL 4 MG/2ML IJ SOLN
INTRAMUSCULAR | Status: AC
Start: 2014-02-23 — End: ?
  Filled 2014-02-23: qty 2

## 2014-02-23 MED ORDER — SODIUM CHLORIDE 0.9 % IJ SOLN
0.4000 mg | INTRAMUSCULAR | Status: DC | PRN
Start: 2014-02-23 — End: 2014-02-24

## 2014-02-23 MED ORDER — MORPHINE SULFATE 2 MG/ML IJ/IV SOLN (WRAP)
1.0000 mg | Status: DC | PRN
Start: 2014-02-23 — End: 2014-02-23
  Administered 2014-02-23 (×3): 1 mg via INTRAVENOUS
  Filled 2014-02-23 (×3): qty 1

## 2014-02-23 MED ORDER — ONDANSETRON HCL 4 MG/2ML IJ SOLN
4.0000 mg | Freq: Three times a day (TID) | INTRAMUSCULAR | Status: DC | PRN
Start: 2014-02-23 — End: 2014-02-24

## 2014-02-23 MED ORDER — HYDROCODONE-ACETAMINOPHEN 10-325 MG PO TABS
1.0000 | ORAL_TABLET | ORAL | Status: DC | PRN
Start: 2014-02-23 — End: 2014-02-24

## 2014-02-23 MED ORDER — IOHEXOL 350 MG/ML IV SOLN
100.0000 mL | Freq: Once | INTRAVENOUS | Status: AC | PRN
Start: 2014-02-23 — End: 2014-02-23
  Administered 2014-02-23: 100 mL via INTRAVENOUS

## 2014-02-23 MED ORDER — SODIUM CHLORIDE 0.9 % IV SOLN
INTRAVENOUS | Status: DC
Start: 2014-02-23 — End: 2014-02-23

## 2014-02-23 MED ORDER — PRAVASTATIN SODIUM 20 MG PO TABS
20.0000 mg | ORAL_TABLET | Freq: Every evening | ORAL | Status: DC
Start: 2014-02-23 — End: 2014-02-24
  Administered 2014-02-23: 20 mg via ORAL
  Filled 2014-02-23 (×3): qty 1

## 2014-02-23 MED ORDER — SODIUM CHLORIDE 0.9 % IV BOLUS
1000.0000 mL | Freq: Once | INTRAVENOUS | Status: AC
Start: 2014-02-23 — End: 2014-02-23
  Administered 2014-02-23: 1000 mL via INTRAVENOUS

## 2014-02-23 MED ORDER — HYDROCODONE-ACETAMINOPHEN 5-325 MG PO TABS
2.0000 | ORAL_TABLET | Freq: Four times a day (QID) | ORAL | Status: DC | PRN
Start: 2014-02-23 — End: 2014-02-24
  Administered 2014-02-23 – 2014-02-24 (×3): 2 via ORAL
  Filled 2014-02-23 (×3): qty 2

## 2014-02-23 NOTE — ED Notes (Signed)
Pt reports bilateral abd pain that started in AM and has gotten worse throughout the day. Pt also reports vomiting and not being able to keep any food or fluids down. Pt also c/o CP secondary to abd pain. Describes abd pain as "burning". Denies urinary symptoms, diarrhea or fever.

## 2014-02-23 NOTE — Progress Notes (Signed)
I saw the patient and examined him.He has right hip pain and ordered MRI which showed Acute nondisplaced/minimally displaced transverse fracture of the right acetabulum and associated small acute bone  contusion of the right femoral head. The right acetabular fracture is occult on the recent CT.  2. No acute intra-abdominal/intrapelvic process.  I called Dr.Brown and will see him tomorrow.His pain is well managed.

## 2014-02-23 NOTE — Progress Notes (Signed)
Pt arrived to room 504 from the ED at 0625. Pt oriented to room/call bell. Pt c/o RLQ pain at this time. IV fluids infusing. Call bell in reach, will continue to monitor.

## 2014-02-23 NOTE — H&P (Signed)
[facility]  [employer] hospital[facility]  HISTORY AND PHYSICAL      Patient: Gary Vazquez.  Date: 02/23/2014   DOB: [birth year]  Admission Date: 02/23/2014   MRN: [id number]  Attending: Dr.Corry Storie Vladimir Faster ,MD      CODE STATUS: Full    PRIMARY CARE MD: Christa See, MD (General)    Chief Complaint   Patient presents with   . Abdominal Pain   . Chest Pain      History Gathered From: Patient    HISTORY AND PHYSICAL     Gary Vazquez. is a 48 y.o. male with history of hypertension hyperlipidemia chronic arthritis presented with abdominal pain, vomiting multiple times, nausea. He did fell, slipped  from ladder landed on his right hip, since then he was lying carefully on his buttocks while sleeping. He was handling the pain in the back and right hip from past 2 days. He got up yesterday morning from bed with severe nausea and abdominal pain, squeezing type in the right lower quadrant radiating to mid abdomen as well as left side of her abdomen. Also noticed to have persistent vomiting 6-7 times in a day. He remembers eating outside prior day [place] subway with his spouse. Spouse not sick. In ED he did get CT scan of the abdomen and right upper quadrant ultrasound of abdomen with no abnormal/acute pathology. He was given 4 mg of IV Zofran as well as Dilaudid with no help. Prior to his ED visit his pain is 10 over 10 in severity now 6/10.  --- Hip pain 10 over 10 also noted to have spasms  Denied fever or chills diarrhea, chest pain or shortness of breath, dizziness  We were consulted for further evaluation of his abdominal pain    Past Medical History   Diagnosis Date   . HTN (hypertension)    . Hyperlipidemia    . Arthritis        Past Surgical History   Procedure Laterality Date   . Appendectomy     . Abdominal surgery     . Appendectomy     . Spleen surgery       not splenectomy         Prior to Admission medications    Medication Sig Start Date End Date Taking? Authorizing Provider   aspirin 81 MG  chewable tablet Chew 1 tablet (81 mg total) by mouth daily. 07/20/13   Rodman Pickle, MD   hydrOXYzine (VISTARIL) 25 MG capsule Take 1 capsule (25 mg total) by mouth 3 (three) times daily as needed for Anxiety. 10/11/13   Merita Norton, NP   lisinopril (PRINIVIL,ZESTRIL) 10 MG tablet Take 10 mg by mouth daily.    [provider]   meloxicam (MOBIC) 7.5 MG tablet Take 1-2 tablets (7.5-15 mg total) by mouth daily as needed for Pain. 01/07/14   Lynnae Prude, PA   metoprolol XL (TOPROL-XL) 25 MG 24 hr tablet Take 25 mg by mouth daily.    [provider]   naproxen (NAPROSYN) 500 MG tablet Take 1 tablet (500 mg total) by mouth 2 (two) times daily with meals. 10/11/13   Merita Norton, NP   pravastatin (PRAVACHOL) 40 MG tablet Take 0.5 tablets (20 mg total) by mouth daily. 07/20/13   Rodman Pickle, MD       No Known Allergies    Family History   Problem Relation Age of Onset   . Diabetes Father    .  Heart attack Father    . Heart disease Father    . Hyperlipidemia Father    . Hypertension Father        History   Substance Use Topics   . Smoking status: Current Every Day Smoker -- 1.00 packs/day for 32 years   . Smokeless tobacco: Never Used   . Alcohol Use: No      Comment: Heavy Drinker in past, quit 3 years ago       REVIEW OF SYSTEMS     14 point review of systems were reviewed and were negative except those mentioned in history of present illness    PHYSICAL EXAM     Vital Signs (most recent): BP 145/74 mmHg  Pulse 59  Resp 17  SpO2 97%  Constiutional: . Patient speaks full sentences.   HEENT: NC/AT, PERRL, no scleral icterus or conjunctival pallor, no nasal discharge,  oropharynx without erythema or exudate  Neck: trachea midline, supple, no cervical or supraclavicular lymphadenopathy or masses  Cardiovascular: RRR, normal S1 S2, no murmurs, gallops, palpable thrills, no JVD, DP and radial pulses 2+ and symmetric.  Respiratory: No increased work of breathing. Clear to auscultation and percussion  bilaterally.  Gastrointestinal: Slightly rigid, tenderness and right lower quadrant and left lower quadrant as well as left upper quadrant, also noted to have rebound tenderness and guarding, +BS present,  no hepatosplenomegaly  Genitourinary: no suprapubic or costovertebral angle tenderness  Musculoskeletal: Range of motion limited and right lower extremity, severe tenderness in the right hip hip region, as well as in right groin. No rotation of leg noted  Skin: no rashes, jaundice or other lesions  Neurologic: EOMI, CN 2-12 grossly intact. no gross motor or sensory deficits, patellar and bicep DTR 2+ and symmetric, downward plantar reflexes  Psychiatric: alert, interactive, appropriate, normal affect    LABS & IMAGING     Recent Results (from the past 24 hour(s))   Basic Metabolic Panel    Collection Time: 02/23/14 12:28 AM   Result Value Ref Range    Sodium 139 136 - 147 mMol/L    Potassium 3.8 3.5 - 5.3 mMol/L    Chloride 103 98 - 110 mMol/L    CO2 21.1 20.0 - 30.0 mMol/L    CALCIUM 9.8 8.5 - 10.5 mg/dL    Glucose 161 (H) 70 - 99 mg/dL    Creatinine 0.96 0.45 - 1.30 mg/dL    BUN 15 7 - 22 mg/dL    Anion Gap 40.9 (H) 7.0 - 18.0 mMol/L    BUN/Creatinine Ratio 14.9 10.0 - 30.0 Ratio    EGFR >60 mL/min/1.52m2    Osmolality Calc 280 275 - 300 mOsm/kg   CBC    Collection Time: 02/23/14 12:28 AM   Result Value Ref Range    WBC 15.6 (H) 4.0 - 11.0 K/cmm    RBC 5.47 4.00 - 5.70 M/cmm    Hemoglobin 17.7 (H) 13.0 - 17.5 gm/dL    Hematocrit 81.1 91.4 - 52.5 %    MCV 93 80 - 100 fL    MCH 32 28 - 35 pg    MCHC 35 32 - 36 gm/dL    RDW 78.2 95.6 - 21.3 %    PLT CT 253 130 - 440 K/cmm    MPV 8.1 6.0 - 10.0 fL    NEUTROPHIL % 81.8 (H) 42.0 - 78.0 %    Lymphocytes 11.6 (L) 15.0 - 46.0 %    Monocytes 5.1 3.0 - 15.0 %  Eosinophils % 0.9 0.0 - 7.0 %    Basophils % 0.6 0.0 - 3.0 %    Neutrophils Absolute 12.8 (H) 1.7 - 8.6 K/cmm    Lymphocytes Absolute 1.8 0.6 - 5.1 K/cmm    Monocytes Absolute 0.8 0.1 - 1.7 K/cmm    Eosinophils  Absolute 0.1 0.0 - 0.8 K/cmm    BASO Absolute 0.1 0.0 - 0.3 K/cmm   Lipase    Collection Time: 02/23/14 12:28 AM   Result Value Ref Range    Lipase 104 (H) 8 - 78 U/L   LFTs    Collection Time: 02/23/14 12:28 AM   Result Value Ref Range    Protein, Total 7.4 6.0 - 8.3 gm/dL    Albumin 4.2 3.5 - 5.0 gm/dL    Alkaline Phosphatase 66 40 - 145 U/L    ALT 30 0 - 55 U/L    AST (SGOT) 17 10 - 42 U/L    Bilirubin, Total 0.4 0.1 - 1.2 mg/dL    Bilirubin, Direct 0.1 0.0 - 0.3 mg/dL    Albumin/Globulin Ratio 1.31 0.70 - 1.50 Ratio    Globulin 3.2 2.0 - 4.0 gm/dL   Cardiac Profile with Troponin    Collection Time: 02/23/14 12:28 AM   Result Value Ref Range    Creatinine Kinase MB (CKMB) 1.5 0.1 - 6.0 ng/mL    Creatine Kinase (CK) 96 30 - 230 U/L    Troponin I 0.01 0.00 - 0.02 ng/mL    CKMB Index NI 0.0 - 2.3 %   Urinalysis with Culture in indicated    Collection Time: 02/23/14  2:50 AM   Result Value Ref Range    Color, UA Yellow Colorless,Yellow,Straw    Clarity, UA Clear Clear    Specific Gravity, UR 1.045 (H) 1.001 - 1.040    pH, Urine 7.0 5.0 - 8.0 pH    Protein, UR Negative Negative mg/dL    Glucose, UA Negative Negative mg/dL    Ketones UA Negative Negative,5 mg/dL    Bilirubin, UA Negative Negative    Blood, UA Negative Negative    Nitrite, UA Negative Negative    Urobilinogen, UA Normal Normal mg/dL    Leukocyte Esterase, UA Negative Negative Leu/uL    WBC, UA 1 0 - 4 /hpf    RBC, UA 2 0 - 4 /hpf    Bacteria, UA Rare (A) None /hpf       MICROBIOLOGY:  Blood Culture: Done   Urine Culture--signed   Antibiotics Started: None  IMAGING:  CT Abdomen Pelvis W IV And PO Cont    (Results Pending)   US ABDOMEN LIMITED RUQ    (Results Pending)   XR HIP RIGHT AP AND LATERAL    (Results Pending)       CARDIAC:  EKG Interpretation:  No ST-T wave elevations or depressions    Markers:    Recent Labs  Lab 02/23/14  0028   CREATINE KINASE (CK) 96   CKMB 1.5   CKMB INDEX NI   TROPONIN I 0.01       EMERGENCY DEPARTMENT COURSE:  Orders  Placed This Encounter   Procedures   . CT Abdomen Pelvis W IV And PO Cont   . US ABDOMEN LIMITED RUQ   . XR HIP RIGHT AP AND LATERAL   . Basic Metabolic Panel   . CBC   . Lipase   . LFTs   . Urinalysis with Culture in indicated   . Cardiac Profile with Troponin   .  ECG 12 lead   . Insert peripheral IV   . Place  for Observation Services   . [facility] ED Bed Request       ASSESSMENT & PLAN     Gary Vazquez. is a 48 y.o. male with above PMH and PSH who is being admitted under  observation with abdominal pain    Patient Active Problem List   Diagnosis   . Tobacco dependence   . Heel pain, left   . Bone spur   . Arthritis   . Hypertension   . Dyslipidemia   . Pre-diabetes   . Right lower quadrant abdominal pain   . Abdominal pain     1. Right lower quadrant abdominal pain  Admit under obs --differential diagnosis broad questionable hip joint abnormality versus secondary to UTI versus musculoskeletal pain  CT scan of the abdomen preliminary report showed no acute pathology  Ultrasound right upper quadrant prelim does showed no acute pathology  Most likely right lower quadrant pain might be the spasm from the right hip pain  Will do x-ray of the right hip to further evaluate for possibility of fracture  Pain medication when necessary when necessary  Monitor bowel movements he did have 2 bowel movements yesterday not likely constipation  2. Hypertension uncontrolled  Most likely secondary to severe pain  Hydralazine IV when necessary for systolic blood pressure greater than 160  Continue other home medications  3. Leukocytosis  Most likely secondary to severe pain  Unlikely infection  Ordered blood cultures urine cultures sputum culture  4 heel pain secondary to spur which is chronic  5. Dyslipidemia continue home medications  6. Arthritis continue home medications  7. Tobacco dependence start on nicotine patch    GI Prophylaxis  Protonix IV    Nutrition  Cardiac diet     DVT/VTE Prophylaxis  Heparin 3 times a day  Plan  was verbalized to the patient and patient is in agreement with the plan    Signed,  Marella Chimes, MD  02/23/2014 5:01 AM    CC: PCP and all specialists listed above

## 2014-02-23 NOTE — Progress Notes (Signed)
Received  E mail from CM stating Gary Vazquez needs transition clinic follow up and that he is no longer in the [facility]. Chart reviewed he was referred to [facility]  and had an appointment  On January 15  And he no showed for the appointment  [facility].since then he has had 2 additional ER encounters  and he was a NO show for his March 30 th follow up appointment in the transition clinic.A phone call was place to Gary Vazquez  and he was unaavailble I spoke with his wife  in addition a reminder letter was sent asking him to see follow up care . He now  readmitted

## 2014-02-23 NOTE — Progress Notes (Signed)
[facility]  [address]  [place] [address]    INITIAL ASSESSMENT  Case Management / Social Work      Estimated D/C Date: 02/25/14      PCP/Last visit: none      Home assessment/PLOF: Married, spouse works part-time,  PMH includes HTN, chest pain.      Financials and Insurance:  Unfunded, med assist referral made      [employer]:   Did not follow through at the [facility], couldn't give explanation why he failed to remain established .     DME's/Supplier:   none    Inpatient Plan of Care: Right sided abdominal pain workup.  US/ no evidence of acute cholecystitis, MRI  pelvis pending, Morphine, Zofran        CM Interventions: Rev chart, met with pt. Sent referral to Med [facility] and [facility]/ Transition [facility]. May need [facility] meds      Transportation: TBD      Barriers to discharge:  unfunded, No PCP      D/C Plan and Needs:    Anticipating pt will rtn home with spouse , will need scheduled f/u  at the Transition clinic if unable to get in at the [facility]. Spoke with Darl Pikes at the [facility] , pt applied for services in January  2015 and never f/u with a call to [facility]  [phone] ext [phone] to get established there with a Thursday night appointment.        Meyer Russel, RN BSN  Case Manager  [facility]  Ph [phone]  Fx [phone]

## 2014-02-23 NOTE — ED Notes (Signed)
Bed: C16-A  Expected date:   Expected time:   Means of arrival:   Comments:  ems

## 2014-02-23 NOTE — Discharge Summary (Addendum)
[facility]      Patient: Gary Vazquez.  Admission Date: 02/23/2014   DOB: 07-Jan-1966  Discharge Date: 02/24/2014    MRN: [id number]  Discharge Attending:Yovan Leeman Jeanmarie Plant, MD   Referring Physician: Christa See, MD (General)  PCP: Christa See, MD (General)       DISCHARGE SUMMARY     Discharge Information       Discharge Diagnoses:   Active Problems:  Acute nondisplaced/minimally displaced transverse fracture of the right acetabulum and associated small acute bone  contusion of the right femoral head    Right lower quadrant abdominal pain    Abdominal pain  Gastritis secondary to NSAID  Cholelithiasis       Additional Diagnosis Evaluated and treated During admission;    Consult: none      Discharge Medications:     Medication List      START taking these medications         HYDROcodone-acetaminophen 5-325 MG per tablet   Commonly known as:  NORCO   Take 2 tablets by mouth every 6 (six) hours as needed.         CHANGE how you take these medications         aspirin EC 81 MG EC tablet   What changed:  Another medication with the same name was removed. Continue taking this medication, and follow the directions you see here.         CONTINUE taking these medications         lisinopril 10 MG tablet   Commonly known as:  PRINIVIL,ZESTRIL       metoprolol XL 25 MG 24 hr tablet   Commonly known as:  TOPROL-XL       pravastatin 40 MG tablet   Commonly known as:  PRAVACHOL   Take 0.5 tablets (20 mg total) by mouth daily.         STOP taking these medications         hydrOXYzine 25 MG capsule   Commonly known as:  VISTARIL       meloxicam 7.5 MG tablet   Commonly known as:  MOBIC       naproxen 500 MG tablet   Commonly known as:  NAPROSYN         Where to Get Your Medications    These are the prescriptions that you need to pick up.        You may get the following medications from any pharmacy   -  HYDROcodone-acetaminophen 5-325 MG per tablet                          Hospital Course   History of   Hospital Course    Warren Lindahl. is a 48 y.o. male with history of hypertension hyperlipidemia chronic arthritis presented with abdominal pain, vomiting multiple times, nausea. He did fell, slipped from ladder landed on his right hip, since then he was lying carefully on his buttocks while sleeping. He was handling the pain in the back and right hip from past 2 days. He got up yesterday morning from bed with severe nausea and abdominal pain, squeezing type in the right lower quadrant radiating to mid abdomen as well as left side of her abdomen. Xray of the right hip did not show any fracture.He was on meloxicam and naproxen which may irritate his stomach.I advised him not to take this medications.I also advised him to follow  with a surgeon as an out patient for his gallstone.I also advised him to come back if he developed the abdominal pain.At this time he has no any abdominal pain and want to be discharged. Patient continued with right groin pain and we did mri of the pelvic which showed Acute nondisplaced/minimally displaced transverse fracture of the right acetabulum and associated small acute bone  contusion of the right femoral head.We consulted Dr.Brown from orthopedic surgery and he want him to - TTDWB, pain control and  f/u in the office in two weeks.    Procedures/Imaging:   MRI PELVIS WO CONTRAST   Final Result   Clinical History:   right lower quadrant pain. Right lower quadrant pain after fall.      Examination:   MRI PELVIS WO CONTRAST      Comparison:   CT abdomen pelvis with contrast February 23, 2014      Findings:   There is an acute nondisplaced/minimally displaced transverse fracture of    the right acetabulum involving the roof and   medial wall.      There is an associated small acute bone contusion of the anterior aspect    of the femoral head. The bone contusion   involves less than 25% of the femoral head.      There is an associated small right hip joint effusion.      There is no  dislocation of the right hip.      There is no other pelvic fracture or dislocation.      There is no acute intra-abdominal/intrapelvic process. The bladder,    prostate and visualized bowel are unremarkable.      IMPRESSION:    1.  Acute nondisplaced/minimally displaced transverse fracture of the    right acetabulum and associated small acute bone   contusion of the right femoral head. The right acetabular fracture is    occult on the recent CT.   2.  No acute intra-abdominal/intrapelvic process.      ReadingStation:[id number]      XR Screening Orbits For MRI   Final Result   Clinical History:   Hx of metal shavings in eyes.      Examination:   3 views of the orbits      IMPRESSION:    No evidence of metallic foreign body.      ReadingStation:[email or web address]      CT Abdomen Pelvis W IV And PO Cont   Final Result   Clinical History:   Lower abdominal pain with history of prior appendectomy      Technique:   CT of abdomen and pelvis with intravenous and oral contrast obtained.    Multiplanar reconstructions performed.      Contrast:   100 cc Omnipaque 350      Comparison:   Right upper quadrant ultrasound dated February 23, 2014, and CT scan of the    chest dated March 16, 2005      Findings:      Lower chest:   The lung bases are clear. No pleural effusions.      Abdomen:   A too small to characterize hypodensity is noted in the posterior right    hepatic lobe. The spleen and pancreas are   unremarkable. Stable appearing bilateral adrenal nodules thought to    represent adenomas are again noted, left greater   than right, similar in appearance to prior study from 2006. Peripherally    calcified gallstone is seen in the neck of  the   gallbladder. There is no pericholecystic fluid, pericholecystic    inflammatory stranding, or appreciable gallbladder wall   edema.      There is no evidence of small bowel obstruction. The appendix is not    visualized, consistent with history of prior   appendectomy. There is no abdominal  adenopathy or free intraperitoneal    air.      Pelvis:   The urinary bladder is unremarkable. There is no free fluid or pelvic    adenopathy. There are small bilateral   fat-containing inguinal hernias.      Bones and Soft Tissues:   No aggressive osseous lesion is identified. Early degenerative disc    disease changes are noted in the lumbar spine.      IMPRESSION:       1. No evidence of acute inflammatory process in the abdomen or pelvis.      2. Cholelithiasis without CT evidence of acute cholecystitis.      ReadingStation:[id number]      US ABDOMEN LIMITED RUQ   Final Result   Ultrasound abdomen limited right upper quadrant      Indication: Right upper quadrant pain.      Technique: Wallace Cullens scale imaging was utilized with color Doppler.       Comparison: No prior pertinent studies are available for comparison at    this institution.       Findings:      Liver is normal in size and echotexture. No focal mass is seen. There is    no intrahepatic biliary ductal dilation. Common   duct measures 0.4 cm.      Gallbladder demonstrates a nonmobile stone in the gallbladder neck,    measuring approximately 9 mm in size. There is no   pericholecystic fluid or gallbladder wall edema. Sonographic Murphy's sign    is negative.       Right kidney measures 11.5 cm and demonstrates normal echotexture. No    focal mass, hydronephrosis, nephrolithiasis, or   cortical thinning is identified. No perinephric fluid collection is    identified.      Visualized pancreas is homogeneous, without focal mass. Pancreatic tail is    not well seen secondary to attenuation by   overlying structures.      Upper abdominal IVC and abdominal aorta are not well visualized.      IMPRESSION:       Nonmobile stone in the neck of the gallbladder, but no sonographic    evidence of acute cholecystitis.   ReadingStation:[id number]      XR HIP RIGHT AP AND LATERAL   Final Result   Clinical History:   Limited range of motion with right hip and right groin  tenderness      Examination:   AP and lateral/frog leg views of the right hip.      Comparison:   CT scan of the abdomen and pelvis dated February 23, 2014      IMPRESSION:       No acute fracture or dislocation is identified. There is small bony    overgrowth at the superolateral junction of the   femoral head and neck, which can be seen with cam-type femoral acetabular    impingement. There is mild joint space   narrowing and subchondral sclerosis of the femoroacetabular joint.    Excreted contrast from recent CT scan is seen   opacifying the urinary bladder.      ReadingStation:[email or web address]  Treatment Team:   Attending Provider: Karren Cobble, MD  Consulting Physician: Randolm Idol, MD         Progress Note/Physical Exam at Discharge       Filed Vitals:    02/23/14 1610 02/23/14 1700 02/23/14 2248 02/24/14 0702   BP: 151/98 113/61 129/74 146/86   Pulse: 66 87 78 70   Temp: 97.9 F (36.6 C) 97.7 F (36.5 C) 97.8 F (36.6 C) 97.7 F (36.5 C)   TempSrc: Oral Oral Oral Oral   Resp: 18 18 20 19    Height: 1.88 m (6\' 2" )      Weight: 96.344 kg (212 lb 6.4 oz)      SpO2: 97% 97% 98% 97%       Exam:   General: Patient is awake, in no acute distress  Eyes - pupils equal and reactive, non icteric sclera.  Mouth - mucous membranes moist, pharynx normal without lesions  Neck: supple, without JVD  Chest: . No crackles or wheezing.  CVS: S1, S2  Well heard normal,  no murmurs or gallops.   Abdomen: soft and non tender with normal bowel sounds.  Musculoskeletal: No  edema, or deformity.  NEURO:Alert and oriented x 3. No gross focal motor or sensory neurological deficits  Skin: No rashes or echymosis.       Diagnostics     Labs/Studies Pending at Discharge: No    Last Labs     Recent Labs  Lab 02/24/14  0336 02/23/14  0028   WBC 6.8 15.6*   RBC 4.70 5.47   HEMOGLOBIN 15.6 17.7*   HEMATOCRIT 45.1 50.8   MCV 96 93   PLATELETS 190 253         Recent Labs  Lab 02/24/14  0336 02/23/14  0028   SODIUM 141 139    POTASSIUM 4.0 3.8   CHLORIDE 106 103   CO2 26.1 21.1   BUN 15 15   CREATININE 0.89 1.01   GLUCOSE 123* 124*   CALCIUM 8.7 9.8             Microbiology Results    Procedure Component Value Units Date/Time    Blood Culture #1 - Venipuncture [[id number]] Collected:  02/23/14 0714    Specimen Information:  Blood Updated:  02/23/14 0750           Patient Instructions   Discharge Diet: cardiac diet  Discharge Activity:  activity as tolerated    Follow Up Appointment:  Follow-up Information    Follow up with [facility] In 1 week.    Specialty:  Internal Medicine    Why:  Aug 5th at 2:00    Contact information:    [address]  [place] [address]  [phone]        Follow up with Dr. Greig Castilla brown with in two weeks.           Time spent examining patient, discussing with patient/family regarding hospital course, chart review, reconciling medications and discharge planning: 40 minutes.      Karren Cobble, MD  12:41 PM 02/24/2014     Cc: PCP

## 2014-02-23 NOTE — Progress Notes (Signed)
Pt has not returned to his room yet.

## 2014-02-23 NOTE — Progress Notes (Signed)
Assumed care at 0700, assessment complete, no s/s of distress, needs met, call bell within reach, will continue to monitor

## 2014-02-23 NOTE — ED Provider Notes (Signed)
Physician/Midlevel provider first contact with patient: 02/23/14 0041           EMERGENCY DEPARTMENT  History and Physical Exam       Patient Name: Gary Vazquez,[name]  Encounter Date:  02/23/2014  Attending Physician: Rosalyn Gess. Grace Isaac, M.D.  PCP: Christa See, MD (General)  Patient DOB:  1965-10-20  MRN:  [id number]  Room:  C16/C16-A    Medical Decision Making     Patient in obvious pain. He states abdominal pain started this morning. Started in the right lower quadrant is noted to the left side. States she's never had anything like this. He has had his appendix out. No other history of abdominal problems. He states he did get some chest tightness that he thinks is due to anxiety due to his abdominal pain. Is not currently having any chest tightness. Patient states he had a bowel movement  about 5 hours ago. This did not change his pain.    On exam, the patient is somewhat hypertensive.  At 0100 hrs. it is still no temperature documented. I have requested 1. Patient has been vomiting. He says his belly is benign. I do not hear tympanitic bowel sounds. His bowel sounds are decreased. Consider perfect viscus or bowel obstruction. Consider diverticulitis.    Patient's white count is elevated 15. His LFTs. Lipase and Chem-8 are fairly unremarkable. CT does not show any acute pathology except for a gallstone. There is no evidence of biliary dilatation. His symptoms are not consistent with cholecystitis. He looks quite miserable. unclear where his pain is coming from.    I have asked the hospital to admit the patient for further evaluation. I have ordered an ultrasound of the right upper quadrant but have low suspicion this is the cause. This may be neurogenic. The patient does state he tripped a couple of days ago, but he had not been having much pain associated with this. He does not seem to have any pain with hip movement.    In addition to the above history, please see nursing notes. Allergies, meds, past  medical, family, social hx, and the results of the diagnostic studies performed have been reviewed by myself.         Diagnosis / Disposition     Clinical Impression  1. Non-intractable vomiting with nausea, vomiting of unspecified type    2. Abdominal pain in male        Disposition  ED Disposition    Observation Admitting Physician: Marlou Sa [[id number]]  Diagnosis: Abdominal pain [1190313]  Estimated Length of Stay: < 2 midnights  Tentative Discharge Plan?: Home or Self Care [1]  Patient Class: Observation [104]              Follow up for Discharged Patients  No follow-up provider specified.    Prescriptions for Discharged Patients  New Prescriptions    No medications on file                   History of Presenting Illness     Chief complaint: Abdominal Pain and Chest Pain    HPI/ROS is limited by: none  HPI/ROS given by: patient    Gary Reap. is a 48 y.o. male who presents to the ED with complaint of abdominal pain which travels from his LRQ across to his LLQ. Patient reports 20 vomiting episodes. He reports not being able to keep food, liquid or medication down. He had his appendix removed when he was  younger.  Pain is sharp and severe.       Review of Systems   Constitutional: No fever.  No chills.    GI: No nausea.  + vomiting.  No diarrhea. No bright red blood/melena per rectum. + RLQ radiating to LLQ abdominal pain.     Ears:  No ear pain.    Nose:  No congestion.  No discharge    Throat:  No sore throat.  No difficulty swallowing.    Cardiovascular: No chest pain.  No palpitations.    Respiratory: No cough.  No shortness of breath.    GU:  No dysuria. No hematuria.     Neurological:  No headache.  No weakness.    Musculoskeletal:  No pain.    Skin:  No rash.  No skin lesions.    Endocrine:  No weight change    Psychiatric:  No depression.  No anxiety.      All other systems reviewed and negative except as above, pertinent findings in HPI.     Allergies & Medications     Pt has No Known  Allergies.    Current/Home Medications    HYDROXYZINE (VISTARIL) 25 MG CAPSULE    Take 1 capsule (25 mg total) by mouth 3 (three) times daily as needed for Anxiety.    LISINOPRIL (PRINIVIL,ZESTRIL) 10 MG TABLET    Take 10 mg by mouth daily.    MELOXICAM (MOBIC) 7.5 MG TABLET    Take 1-2 tablets (7.5-15 mg total) by mouth daily as needed for Pain.    METOPROLOL XL (TOPROL-XL) 25 MG 24 HR TABLET    Take 25 mg by mouth daily.    NAPROXEN (NAPROSYN) 500 MG TABLET    Take 1 tablet (500 mg total) by mouth 2 (two) times daily with meals.    PRAVASTATIN (PRAVACHOL) 40 MG TABLET    Take 0.5 tablets (20 mg total) by mouth daily.        Past Medical History     Pt has a past medical history of HTN (hypertension); Hyperlipidemia; and Arthritis.     Past Surgical History     Pt  has past surgical history that includes Appendectomy; Abdominal surgery; Appendectomy; and spleen surgery.     Family History     The family history includes Diabetes in his father; Heart attack in his father; Heart disease in his father; Hyperlipidemia in his father; Hypertension in his father.     Social History     Pt reports that he has been smoking.  He has never used smokeless tobacco. He reports that he uses illicit drugs (Marijuana and Other-see comments). He reports that he does not drink alcohol.     Physical Exam     Blood pressure 155/75, pulse 83, temperature 98.1 F (36.7 C), temperature source Oral, resp. rate 16, SpO2 98 %.    Vitals signs reviewed.    Constitutional:  Very uncomfortable appearing.  Well-nourished.  Moderate distress due to pain.    Head:  Atraumatic, normocephalic    Eyes:  Pupils equal, and round.  Conjunctiva clear. No injection.    Nose:   No discharge.     Throat:  Oropharynx moist.  No erythema.  No exudates     Neck:  Supple, non tender.  No cervical lymphadenopathy. No JVD.    Respiratory:  Breath sounds normal.  No distress. No wheezes, rales or rhonchi.     Cardiovascular:  Heart normal rate and regular rhythm.  No murmurs/gallops/rubs.  Pulses +2 bilaterally    Abdomen:  Soft. + tender with palpations.  decreased bowel sounds. No distension. No bruits. + guarding. No rebound.     Back:  No CVA tenderness bilaterally. No spinal Tenderness.    Extremities:  Full range of motion.  No edema.  No cyanosis.  No deformity.    Skin:  Warm.  Dry.  No pallor.  No rashes.  No lesions.  No bruises    Neurological:  Alert. Oriented to person,place, time.  GCS 15.  No focal motor deficits.   Cranial Nerves II-XII Grossly intact.    Psychiatric:  Normal affect.  No anxiety.  No depression.  No agitation.       Diagnostic Results     The results of the diagnostic studies have been reviewed by myself:    Radiologic Studies  No results found.         Consultation     CONSULT  02/23/2014 3:32 AM : Placed call to [facility]  Call returned:  3:34  Physician coming to see patient  Note:               Procedures / EKG     None          Casimiro Needle E. Grace Isaac, M.D.      ATTESTATIONS      The documentation recorded by my scribe, Charlsie Merles, accurately reflects the services I personally performed and the decisions made by me.  Rosalyn Gess Grace Isaac, M.D.                       Tori Milks, MD  02/23/14 [phone]

## 2014-02-23 NOTE — UM Notes (Signed)
CC- abd pain, chest pain    Gary Lenoir. is a 48 y.o. male with history of hypertension hyperlipidemia chronic arthritis presented with abdominal pain, vomiting multiple times, nausea. He did fell, slipped from ladder landed on his right hip, since then he was lying carefully on his buttocks while sleeping. He was handling the pain in the back and right hip from past 2 days. He got up yesterday morning from bed with severe nausea and abdominal pain, squeezing type in the right lower quadrant radiating to mid abdomen as well as left side of her abdomen. Also noticed to have persistent vomiting 6-7 times in a day. He remembers eating outside prior day in [place] with his spouse. Spouse not sick. In ED he did get CT scan of the abdomen and right upper quadrant ultrasound of abdomen with no abnormal/acute pathology. He was given 4 mg of IV Zofran as well as Dilaudid with no help. Prior to his ED visit his pain is 10 over 10 in severity now 6/10.  --- Hip pain 10 over 10 also noted to have spasms    PMH- HTN, HLD, arthritis    PSH-appy, abd surgery, spleen surgery    Assessment and Plan  1. Right lower quadrant abdominal pain  Admit under obs --differential diagnosis broad questionable hip joint abnormality versus secondary to UTI versus musculoskeletal pain  CT scan of the abdomen preliminary report showed no acute pathology  Ultrasound right upper quadrant prelim does showed no acute pathology  Most likely right lower quadrant pain might be the spasm from the right hip pain  Will do x-ray of the right hip to further evaluate for possibility of fracture  Pain medication when necessary when necessary  Monitor bowel movements he did have 2 bowel movements yesterday not likely constipation  2. Hypertension uncontrolled  Most likely secondary to severe pain  Hydralazine IV when necessary for systolic blood pressure greater than 160  Continue other home medications  3. Leukocytosis  Most likely secondary to severe  pain  Unlikely infection  Ordered blood cultures urine cultures sputum culture    97.9 F (36.6 C)  Oral  66  97 %  --  18   151/98 mmHg     Dilaudid iv x 3 in ED  zofran iv once in ED  1 liter ivf bolus in ED  Ivf@100    Morphine iv prn x 2 7/29    Lipase 104  Wbc 15.6  BS 124  Urine cx  Blood cx      CT abd pelvis  IMPRESSION:   1. No evidence of acute inflammatory process in the abdomen or pelvis.  2. Cholelithiasis without CT evidence of acute cholecystitis.    [place] abd  IMPRESSION:   Nonmobile stone in the neck of the gallbladder, but no sonographic evidence of acute cholecystitis.    xr hip  IMPRESSION:   No acute fracture or dislocation is identified. There is small bony overgrowth at the superolateral junction of the  femoral head and neck, which can be seen with cam-type femoral acetabular impingement. There is mild joint space  narrowing and subchondral sclerosis of the femoroacetabular joint. Excreted contrast from recent CT scan is seen  opacifying the urinary bladder.

## 2014-02-23 NOTE — Progress Notes (Signed)
Notified from nursing assistant of pt's pain rated 7/10. Pt walking in hallway with wife, states "I am going to walk my wife to the elevator." Pt gait is steady, face free of facial grimacing, appears comfortable. Will continue to assess

## 2014-02-24 ENCOUNTER — Encounter (INDEPENDENT_AMBULATORY_CARE_PROVIDER_SITE_OTHER): Payer: Self-pay | Admitting: Orthopaedic Surgery

## 2014-02-24 ENCOUNTER — Other Ambulatory Visit (INDEPENDENT_AMBULATORY_CARE_PROVIDER_SITE_OTHER): Payer: Self-pay | Admitting: Surgical

## 2014-02-24 ENCOUNTER — Telehealth (INDEPENDENT_AMBULATORY_CARE_PROVIDER_SITE_OTHER): Payer: Self-pay | Admitting: Orthopaedic Surgery

## 2014-02-24 DIAGNOSIS — S32409A Unspecified fracture of unspecified acetabulum, initial encounter for closed fracture: Secondary | ICD-10-CM

## 2014-02-24 DIAGNOSIS — R112 Nausea with vomiting, unspecified: Secondary | ICD-10-CM | POA: Insufficient documentation

## 2014-02-24 LAB — BASIC METABOLIC PANEL
Anion Gap: 12.9 mMol/L (ref 7.0–18.0)
BUN / Creatinine Ratio: 16.9 Ratio (ref 10.0–30.0)
BUN: 15 mg/dL (ref 7–22)
CO2: 26.1 mMol/L (ref 20.0–30.0)
Calcium: 8.7 mg/dL (ref 8.5–10.5)
Chloride: 106 mMol/L (ref 98–110)
Creatinine: 0.89 mg/dL (ref 0.80–1.30)
EGFR: >60 mL/min/{1.73_m2}
Glucose: 123 mg/dL — ABNORMAL HIGH (ref 70–99)
Osmolality Calc: 283 mOsm/kg (ref 275–300)
Potassium: 4 mMol/L (ref 3.5–5.3)
Sodium: 141 mMol/L (ref 136–147)

## 2014-02-24 LAB — CBC AND DIFFERENTIAL
Basophils %: 1 % (ref 0.0–3.0)
Basophils Absolute: 0.1 10*3/uL (ref 0.0–0.3)
Eosinophils %: 3.4 % (ref 0.0–7.0)
Eosinophils Absolute: 0.2 10*3/uL (ref 0.0–0.8)
Hematocrit: 45.1 % (ref 39.0–52.5)
Hemoglobin: 15.6 gm/dL (ref 13.0–17.5)
Lymphocytes Absolute: 2.2 10*3/uL (ref 0.6–5.1)
Lymphocytes: 32.8 % (ref 15.0–46.0)
MCH: 33 pg (ref 28–35)
MCHC: 35 gm/dL (ref 32–36)
MCV: 96 fL (ref 80–100)
MPV: 8.1 fL (ref 6.0–10.0)
Monocytes Absolute: 0.7 10*3/uL (ref 0.1–1.7)
Monocytes: 9.6 % (ref 3.0–15.0)
Neutrophils %: 53.2 % (ref 42.0–78.0)
Neutrophils Absolute: 3.6 10*3/uL (ref 1.7–8.6)
PLT CT: 190 10*3/uL (ref 130–440)
RBC: 4.7 10*6/uL (ref 4.00–5.70)
RDW: 11.9 % (ref 11.0–14.0)
WBC: 6.8 10*3/uL (ref 4.0–11.0)

## 2014-02-24 MED ORDER — HYDROCODONE-ACETAMINOPHEN 5-325 MG PO TABS
2.0000 | ORAL_TABLET | Freq: Four times a day (QID) | ORAL | Status: DC | PRN
Start: 2014-02-24 — End: 2014-03-02

## 2014-02-24 MED ORDER — HYDROCODONE-ACETAMINOPHEN 5-325 MG PO TABS
2.0000 | ORAL_TABLET | Freq: Four times a day (QID) | ORAL | Status: DC | PRN
Start: 2014-02-24 — End: 2014-02-24

## 2014-02-24 NOTE — Telephone Encounter (Signed)
Pt called to make f/up appt for his acetabular fx.  Stated he was advised to be non-wt bearing for 2 months, asked for a work note to cover him off.  Advised this would be written & could be faxed to his employer.  He states he'd prefer to come in & pick it up.  Pt states he was D/C'd with only two days worth of pain medication.  Advised him I'd put a request in for a refill & that someone would call him when ready.  He asked about financial assistance as he is uninsured.  Advised him to pick up financial aid paperwork at the time he picks up his work note.  Pt understood.  Pt D/C'd on 02/24/14 with a script for Norco 5/325mg  #16xo 2 q 6 h written by Karren Cobble, MD.

## 2014-02-24 NOTE — UM Notes (Signed)
Update 7/30    Per MD note on 7/29  saw the patient and examined him.He has right hip pain and ordered MRI which showed Acute nondisplaced/minimally displaced transverse fracture of the right acetabulum and associated small acute bone  contusion of the right femoral head. The right acetabular fracture is occult on the recent CT.  2. No acute intra-abdominal/intrapelvic process.  I called Dr.Brown and will see him tomorrow.His pain is well managed.    Per RN note on 7/29  Notified from nursing assistant of pt's pain rated 7/10. Pt walking in hallway with wife, states "I am going to walk my wife to the elevator." Pt gait is steady, face free of facial grimacing, appears comfortable. Will continue to assess    Per ortho note on 7/30  Dx. Right Ace frx   Hx: Pt states that a few days prior to admission he stepped off of a ladder wrong. He noted abd pain and right hip pain.   PE: No pain to ROTATION of the leg  AP: Right non - displaced frx of the Acetabulum.   - TTDWB  - pain control   - f/u in the office in two weeks. No need for Lovenox but we would like him to take ASA 325mg  daily. PT to work with him prior to leaving.     97.7 F (36.5 C)  Oral  70  97 %  --  19  146/86 mmHg     norco prn x 2 7/30      BS 123      MRI pelvis on 7/29  IMPRESSION:   1. Acute nondisplaced/minimally displaced transverse fracture of the right acetabulum and associated small acute bone  contusion of the right femoral head. The right acetabular fracture is occult on the recent CT.  2. No acute intra-abdominal/intrapelvic process.
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
## 2014-02-24 NOTE — Progress Notes (Signed)
Assumed care at 0700, assessment complete, no s/s of distress, needs met, call bell within reach, will continue to monitor

## 2014-02-24 NOTE — Consults (Signed)
Dx. Right Ace frx   Hx:  Pt states that a few days prior to admission he stepped off of a ladder wrong.  He noted abd pain and right hip pain.   PE:  No pain to ROTATION of the leg  AP:  Right non - displaced frx of the Acetabulum.     - TTDWB   - pain control    - f/u in the office in two weeks.  No need for Lovenox but we would like him to take ASA 325mg  daily.   PT to work with him prior to leaving.     PLEASE SEE FULL DICTATION.

## 2014-02-24 NOTE — Telephone Encounter (Signed)
Of note, pt requested I include his diagnosis in his work note.

## 2014-02-24 NOTE — Progress Notes (Signed)
Pt discharged, education and med scripts given

## 2014-02-28 NOTE — Telephone Encounter (Signed)
Pt picked up script, work note & financial aid packet.

## 2014-03-02 ENCOUNTER — Telehealth (INDEPENDENT_AMBULATORY_CARE_PROVIDER_SITE_OTHER): Payer: Self-pay | Admitting: Orthopaedic Surgery

## 2014-03-02 ENCOUNTER — Ambulatory Visit: Payer: Self-pay | Attending: Family | Admitting: Family

## 2014-03-02 ENCOUNTER — Encounter: Payer: Self-pay | Admitting: Family

## 2014-03-02 ENCOUNTER — Other Ambulatory Visit (INDEPENDENT_AMBULATORY_CARE_PROVIDER_SITE_OTHER): Payer: Self-pay | Admitting: Surgical

## 2014-03-02 VITALS — BP 147/92 | HR 93 | Temp 98.1°F | Resp 20 | Ht 74.0 in | Wt 210.0 lb

## 2014-03-02 DIAGNOSIS — M79609 Pain in unspecified limb: Secondary | ICD-10-CM | POA: Insufficient documentation

## 2014-03-02 DIAGNOSIS — IMO0002 Reserved for concepts with insufficient information to code with codable children: Secondary | ICD-10-CM

## 2014-03-02 DIAGNOSIS — S72009D Fracture of unspecified part of neck of unspecified femur, subsequent encounter for closed fracture with routine healing: Secondary | ICD-10-CM | POA: Insufficient documentation

## 2014-03-02 DIAGNOSIS — F17200 Nicotine dependence, unspecified, uncomplicated: Secondary | ICD-10-CM | POA: Insufficient documentation

## 2014-03-02 DIAGNOSIS — S72001A Fracture of unspecified part of neck of right femur, initial encounter for closed fracture: Secondary | ICD-10-CM | POA: Insufficient documentation

## 2014-03-02 DIAGNOSIS — M79671 Pain in right foot: Secondary | ICD-10-CM

## 2014-03-02 DIAGNOSIS — K829 Disease of gallbladder, unspecified: Secondary | ICD-10-CM

## 2014-03-02 DIAGNOSIS — I2510 Atherosclerotic heart disease of native coronary artery without angina pectoris: Secondary | ICD-10-CM | POA: Insufficient documentation

## 2014-03-02 DIAGNOSIS — R7303 Prediabetes: Secondary | ICD-10-CM

## 2014-03-02 DIAGNOSIS — E785 Hyperlipidemia, unspecified: Secondary | ICD-10-CM | POA: Insufficient documentation

## 2014-03-02 DIAGNOSIS — I10 Essential (primary) hypertension: Secondary | ICD-10-CM | POA: Insufficient documentation

## 2014-03-02 DIAGNOSIS — S32401S Unspecified fracture of right acetabulum, sequela: Secondary | ICD-10-CM

## 2014-03-02 DIAGNOSIS — S72001D Fracture of unspecified part of neck of right femur, subsequent encounter for closed fracture with routine healing: Secondary | ICD-10-CM

## 2014-03-02 DIAGNOSIS — M25559 Pain in unspecified hip: Secondary | ICD-10-CM | POA: Insufficient documentation

## 2014-03-02 DIAGNOSIS — R7309 Other abnormal glucose: Secondary | ICD-10-CM

## 2014-03-02 DIAGNOSIS — Z09 Encounter for follow-up examination after completed treatment for conditions other than malignant neoplasm: Secondary | ICD-10-CM

## 2014-03-02 MED ORDER — PRAVASTATIN SODIUM 20 MG PO TABS
20.0000 mg | ORAL_TABLET | Freq: Every day | ORAL | Status: DC
Start: 2014-03-02 — End: 2014-03-07

## 2014-03-02 MED ORDER — ASPIRIN EC 325 MG PO TBEC
325.0000 mg | DELAYED_RELEASE_TABLET | Freq: Every day | ORAL | Status: DC
Start: 2014-03-02 — End: 2021-04-21

## 2014-03-02 MED ORDER — OXYCODONE-ACETAMINOPHEN 5-325 MG PO TABS
1.0000 | ORAL_TABLET | ORAL | Status: DC | PRN
Start: 2014-03-02 — End: 2014-03-10

## 2014-03-02 MED ORDER — LISINOPRIL 10 MG PO TABS
10.0000 mg | ORAL_TABLET | Freq: Every day | ORAL | Status: DC
Start: 2014-03-02 — End: 2021-04-21

## 2014-03-02 NOTE — Patient Instructions (Signed)
You need to secure an appointment with a Primary Care Provider (PCP) for your health care follow-up, routine health screenings, and possibly refills on prescriptions.  If you need assistance with this, our Nurse Navigator, Pension scheme manager, RN can assist you.  You can call the [facility] at [phone].       You are currently eligible for services at the [facility] through January 2016 for primary care.   You need to go to the [facility] and ask to speak with Caroleen Hamman or [place] and schedule an appointment there.  The phone number to the [facility] is:  [phone].    Your blood pressure and cholesterol medications may be filled as the [facility], as long as you follow the free medical clinic's guidelines.    Continue to follow your hospital discharge instructions.    Keep your appointment with Dr. Manson Passey on 03-07-14 at 1:15pm.    Please review the following:        High Blood Pressure --Established    High Blood Pressure (Hypertension) is a chronic disease. The cause is unknown in most cases. It can usually be controlled with lifestyle changes and/or medicines. Symptoms of high blood pressure may include headache, dizziness, visual changes, chest pain and shortness of breath. Sometimes it causes no symptoms at all. However, even if there are no symptoms, untreated high blood pressure increases the risk of heart attack, also known as acute myocardial infarction, or AMI, and stroke. It is a serious health risk and should not be ignored.  A normal blood pressure is 120/80 or less. The first (top) number is the "systolic" pressure. The second (bottom) number is the "diastolic" pressure. Hypertension exists when either the top number is 140 or higher, OR the bottom number is 90 or higher on repeated measurements.  Home Care:  All patients with high blood pressure should do the following to lower their pressure. If you are on medicines, then these methods may reduce or  eliminate your need for medicines in the future.  1. Begin a weight loss program if you are overweight.  2. Reduce your salt intake.   Avoid high salt foods (olives, pickles, smoked meats, salted potato chips, etc.).   Do not add salt to your food at the table.   Use only small amounts of salt when cooking.  3. Begin an exercise program. Discuss with your doctor what type of exercise program would be best for you. It doesn't have to be difficult. Even brisk walking for 20 minutes three times a week is a good form of exercise.  4. Avoid medicines which contain heart stimulants. This includes many cold and sinus decongestant pills and sprays as well as diet pills. Check the warnings about hypertension on the label. Stimulants such as amphetamine or cocaine could be lethal for someone with hypertension. Never take these.  5. Limit your caffeine intake or switch to caffeine-free products.  6. Stop smoking. If you are a long-time smoker, this can be hard. Enroll in a stop-smoking program to improve your chance of success.  7. Learning how to handle stress better is an important part of any program to lower blood pressure. Learn about relaxation methods such as meditation, yoga or biofeedback.  8. If medicines were prescribed, take them exactly as directed. Missing doses may cause your blood pressure get out of control.  9. Consider buying an automatic blood pressure machine (available at most pharmacies). Use this to monitor your  blood pressure at home and report the results to your doctor.  Follow Up:  Regular visits to your own physician for blood pressure checks and medicine adjustment is an important part of your care. Make a follow-up appointment as directed by our staff.  Get Prompt Medical Attention  if any of the following occur:   Chest pain or shortness of breath   Severe headache   Throbbing or rushing sound in the ears   Nosebleed   Sudden severe abdominal pain   Extreme drowsiness, confusion or  fainting   Dizziness or vertigo (dizziness with spinning sensation)   Weakness of an arm or leg or one side of the face   Difficulty with speech or vision   2000-2014 [employer] [address], [place], [place] [address]. All rights reserved. This information is not intended as a substitute for professional medical care. Always follow your healthcare professional's instructions.

## 2014-03-02 NOTE — Progress Notes (Signed)
Subjective:    Patient ID: Gary Vazquez. is a 48 y.o. male.  He presents to the [facility] for assistance with finding a primary care provider.  He was seen by the [facility] in the past, and is currently eligible for services at the [facility] through January 2016, but he did not keep his initial appointment there, and he has not called the [facility] for follow up appointments for primary care or to obtain his medications.     He was recently hospitalized from February 23, 2014 through February 24, 2014 with RLQ abdominal pain, nausea and vomiting.  He was admitted with cholelithiasis, gastritis secondary to NSAID use, acute nondisplaced/minimally displaced transverse fracture of the right acetabulum and associated small acute bone contusion of the right femoral head.  When questioned him about his right hip fracture, he states, "I stepped in a hole while I was mowing and fell...Marland KitchenMarland Kitchen two days before I went to the hospital".  He is using a crutch with ambulation for toe-touchdown weightbearing on the right lower extremity.    Since his hospital discharge, he denies any falls, dizziness, numbness, loss of sensation, bleeding, chest pain, palpitations, SOB, cough, abdominal pain, nausea, vomiting, or diarrhea.  He reports, "they told me the next time I have stomach pain, to go to the hospital and my gallbladder comes out".  He denies alcohol intake; reports he quit drinking alcohol about 3 years ago.  Continues smoking cigarettes; about 1 PPD.     02-23-14 ultrasound of the abdomin showed:  IMPRESSION:   Nonmobile stone in the neck of the gallbladder, but no sonographic evidence of acute cholecystitis.    CT of abdomin showed:   IMPRESSION:   1. No evidence of acute inflammatory process in the abdomen or pelvis.  2. Cholelithiasis without CT evidence of acute cholecystitis.    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    He reports he has been out of his routine medications since April 2015.  His 02-24-14 hospital discharge summary indicates that he is supposed to be taking the following medications, but he reports he is not, because he can't afford them:  Zestril 10 mg by mouth once a day  Toprol XL 25 mg by mouth once a day (He states, "they stopped this medicine"; but he does not know who the provider was that told him to discontinue it.)  Pravachol 20 mg by mouth once a day  Aspirin EC 81 mg by mouth once a day (However, [name], PA's note from 02-24-14 indicates the patient was told to switch to Aspirin 325 mg by mouth once a day.  The patient reports he is not taking this.)  Since hospital discharge:  Roxicet 5-325 mg by mouth every 4 hours as needed for pain.  (Reports he is taking this.)    He reports he has an appointment with orthopedics (Dr. Despina Vazquez) on 03-07-14 at 1:15pm.     HPI    Review of Systems        Objective:    Physical Exam   Constitutional: He is oriented to person, place, and time. He appears well-developed and well-nourished. No distress.   HENT:   Head: Normocephalic and atraumatic.  Cardiovascular: Normal rate, regular rhythm, normal heart sounds and intact distal pulses.    Pulmonary/Chest: Effort normal and breath sounds normal. No respiratory distress. He has no wheezes. He has no rales.   Abdominal: Soft. Bowel sounds are normal. There is no tenderness. There is no guarding.   Musculoskeletal: He exhibits no edema or tenderness.   Limited motion of right leg at hip joint, otherwise, motion of LUE, LLE and RUE intact. Skin warm and dry.    Neurological: He is alert and oriented to person, place, and time. Coordination normal.   Skin: Skin is warm and dry. No rash noted. He is  not diaphoretic.   Psychiatric: He has a normal mood and affect. His behavior is normal. Judgment and thought content normal.     BP 147/92 mmHg  Pulse 93  Temp(Src) 98.1 F (36.7 C) (Oral)  Resp 20  Ht 1.88 m (6\' 2" )  Wt 95.255 kg (210 lb)  BMI 26.95 kg/m2  SpO2 97%  07-21-13, A1c:  5.7.        Assessment:       1. Follow-up exam    2. Gall bladder disease    3. Hip fracture, right, closed, with routine healing, subsequent encounter    4. Heel pain, right    5. Closed fracture of right hip, with routine healing, subsequent encounter    6.  Hypertension  7. Dyslipidemia  8.  Pre-Diabetes  9.  Tobacco use      Plan:       Met with patient.  Reviewed his lab results, and importance of taking his medications for prevention of CV complications from high cholesterol, lack of anti-platelet benefit, hypertension, and smoking.  He verbalizes understanding.   New prescriptions were written for his Pravachol 20 mg by mouth once a day, Zestril 10 mg by mouth once a day, and Aspirin 325 mg by mouth once a day, and with assistance from our Nurse Navigator, Gary scheme manager, RN, and he will go to [facility] today, when he leaves this appointment, to obtain a free, one month supply of these medications.  I called the [facility] and spoke with Gary Vazquez and confirmed that patient is currently eligible for services there through January 2016, he just needs to go to the [facility] at any time they are open and meet with Gary Vazquez for an appointment.  I instructed patient to go to [facility] ASAP and do this, so that he will have primary care provider, and access to his medications (non-narcotics) at 4 dollars a prescription.  He reports he will do this.  Instructed him to clarify the prescription for Toprol XL 25 mg by mouth once a day with the [facility].  I provided him with the phone number to the [facility] to call for an appointment, if he does not get an appointment with the [facility] in the next  three weeks.  He reports he will do this. Instructed him to keep his upcoming appointment with Dr. Manson Passey on 03-07-14 at 1:15pm and reviewed difference between a specialty practice and a primary care provider.  Instructed him on smoking cessation and the ill effects of nicotine.   I reviewed signs to seek medical attention.  He is aware of signs of gallstones and knows to seek immediate attention for this. Printed out his AVS; reviewed this with him and provided him with a copy.  He verbalizes understanding of the plan of care.

## 2014-03-02 NOTE — Telephone Encounter (Signed)
RX is at the front desk.  Call pt please. glm

## 2014-03-02 NOTE — Telephone Encounter (Signed)
Pt called stating he'll be out of Norco today, given Norco 5/325mg  on 02/24/14 #45 2 q6h.  States it makes him itch, in the past he's had to take Percocet instead.

## 2014-03-02 NOTE — Telephone Encounter (Signed)
PATIENT NOTIFIED TO PICK UP RX

## 2014-03-03 DIAGNOSIS — K8020 Calculus of gallbladder without cholecystitis without obstruction: Secondary | ICD-10-CM | POA: Insufficient documentation

## 2014-03-03 NOTE — Progress Notes (Signed)
On August 5th 2015 Gary Vazquez was scheduled for follow up care  in the [facility] to assist with establishing  PCP.  Chart reviewed for insurance information to help determine  transition to appropriate provider. He was recently July 29,2015 to July 30,2015. He came for his follow up appointment  and he reports that he is eligible for the [place] benefits because he had served in [employer], however he needs to get an hearing evaluation then follow up in the [place] system but he has not done so. Gave him [facility] resources for him to call and review his eligibility . Also Provided him with Homeless resources he reports he is on the verge of being homeless. In addtion he was given [place] [facility] phone # for  [facility] veterans. Encouraged him to access those services . He reports he had not taken any of his medication since discharged. [facility] indigent meds for Lisinopril ,Pravastatin and ASA sent to [facility] pharmacy.he is eligible for the [facility] services until jan of 2016 encourage him to go there for  Primary care until he access the [place] medical system   Chronic Care Management Progress Note     03/03/2014  11:08 AM  Care Team Member: Lynita Lombard Gary Gautier, RN  Patient Active Problem List    Diagnosis Date Noted   . Cholelithiasis 03/03/2014   . Closed fracture of right hip 03/02/2014   . Non-intractable vomiting with nausea, vomiting of unspecified type    . Right lower quadrant abdominal pain 02/23/2014   . Abdominal pain 02/23/2014   . Hypertension 10/11/2013   . Dyslipidemia 10/11/2013   . Pre-diabetes 10/11/2013     On 07-21-13, A1c:  5.7     . Arthritis 08/11/2013   . Bone spur 08/04/2013   . Tobacco dependence 07/18/2013   . Heel pain, left 07/18/2013     Future Appointments  Date Time Provider Department Center   03/07/2014 1:15 PM Randolm Idol, MD [facility]

## 2014-03-07 ENCOUNTER — Ambulatory Visit
Admission: RE | Admit: 2014-03-07 | Discharge: 2014-03-07 | Disposition: A | Discharge status: Home / Self Care | Payer: Self-pay | Source: Ambulatory Visit | Attending: Orthopaedic Surgery | Admitting: Orthopaedic Surgery

## 2014-03-07 ENCOUNTER — Encounter (INDEPENDENT_AMBULATORY_CARE_PROVIDER_SITE_OTHER): Payer: Self-pay | Admitting: Orthopaedic Surgery

## 2014-03-07 ENCOUNTER — Ambulatory Visit (INDEPENDENT_AMBULATORY_CARE_PROVIDER_SITE_OTHER): Payer: Self-pay | Admitting: Surgical

## 2014-03-07 VITALS — BP 150/90 | HR 92 | Wt 210.0 lb

## 2014-03-07 DIAGNOSIS — S72009D Fracture of unspecified part of neck of unspecified femur, subsequent encounter for closed fracture with routine healing: Secondary | ICD-10-CM | POA: Insufficient documentation

## 2014-03-07 DIAGNOSIS — S32401A Unspecified fracture of right acetabulum, initial encounter for closed fracture: Secondary | ICD-10-CM

## 2014-03-07 DIAGNOSIS — S32409A Unspecified fracture of unspecified acetabulum, initial encounter for closed fracture: Secondary | ICD-10-CM

## 2014-03-07 NOTE — Progress Notes (Signed)
Progress Note    Subjective:    Gary Vazquez. is a 48 y.o. male being seen for Follow-up  .    HPI Pt. Is here today for a regular scheduled appt for a f/u on 02/21/14 Right ACE  ORIF.  Overall they are stating that their pain level is 8/10 VAS.  They deny any numbness distally, SOB or CP.  They reports maintaining FWB status.. They are continuing to elevated affected extremity to control edema if needed.    The following portions of the patient's history were reviewed and updated as appropriate: allergies, current medications, past family history, past medical history, past social history, past surgical history and problem list.    Review of Systems   Constitutional: Negative.        Objective:    There were no vitals taken for this visit.    Physical Exam   Constitutional: He is oriented to person, place, and time. He appears well-developed and well-nourished.   HENT:   Head: Normocephalic and atraumatic.   Eyes: EOM are normal.   Neck: Normal range of motion.   Cardiovascular: Normal rate.    Pulmonary/Chest: Effort normal.   Musculoskeletal:        Right hip: He exhibits decreased range of motion and decreased strength. He exhibits no tenderness, no bony tenderness, no swelling, no crepitus and no deformity.   Neurological: He is alert and oriented to person, place, and time. He has normal strength. No sensory deficit.   Skin: Skin is warm and intact. No bruising, no ecchymosis and no rash noted. No cyanosis or erythema. No pallor.   Psychiatric: He has a normal mood and affect.   Nursing note and vitals reviewed.      Assessment:      1. Closed fracture of acetabulum, right, initial encounter          Plan:     At this time we have reviewed their x-rays and clinical evaluation.  All questions have been answered.  I have discussed all option for their plan of care.  I am refilling their pain meds today and feel that they would benefit from PT/OT.  Their WB status with be TTDWB.  I reviewed with him that  he needs to stay off of his Right LE.  Pt. Agrees with plan.       Follow up in: 4weeks

## 2014-03-09 ENCOUNTER — Telehealth (INDEPENDENT_AMBULATORY_CARE_PROVIDER_SITE_OTHER): Payer: Self-pay

## 2014-03-09 ENCOUNTER — Inpatient Hospital Stay (INDEPENDENT_AMBULATORY_CARE_PROVIDER_SITE_OTHER): Payer: Self-pay | Admitting: Orthopaedic Surgery

## 2014-03-09 NOTE — Telephone Encounter (Signed)
PATIENT LEFT VOICEMAIL REQUESTING REFILL OF PAIN MEDICATION. STATES THAT HE IS GOING TO [place] TOMORROW FOR THE NEXT WEEK. PATIENT LAST GIVEN PERCOCET 5/325MG  TAKE 1 Q4 MAX 5/DAY #50 ON 03/02/2014

## 2014-03-10 ENCOUNTER — Other Ambulatory Visit (INDEPENDENT_AMBULATORY_CARE_PROVIDER_SITE_OTHER): Payer: Self-pay | Admitting: Surgical

## 2014-03-10 ENCOUNTER — Telehealth (INDEPENDENT_AMBULATORY_CARE_PROVIDER_SITE_OTHER): Payer: Self-pay | Admitting: Orthopaedic Surgery

## 2014-03-10 MED ORDER — OXYCODONE-ACETAMINOPHEN 5-325 MG PO TABS
1.0000 | ORAL_TABLET | ORAL | Status: DC | PRN
Start: 2014-03-10 — End: 2014-04-05

## 2014-03-10 NOTE — Telephone Encounter (Signed)
Call pt.  Rx is ready. glm

## 2014-03-10 NOTE — Telephone Encounter (Signed)
Pt called to see if med refill was ready.  Advised that it had not yet been addressed.  Pt also stated he no longer has the phone number on file & is only using [phone].  Change made in pt's demographics.

## 2014-03-10 NOTE — Telephone Encounter (Signed)
LM that script is ready.

## 2014-03-15 ENCOUNTER — Emergency Department: Payer: Self-pay

## 2014-03-15 ENCOUNTER — Emergency Department
Admission: EM | Admit: 2014-03-15 | Discharge: 2014-03-15 | Disposition: A | Discharge status: Home / Self Care | Payer: Self-pay | Attending: Emergency Medicine | Admitting: Emergency Medicine

## 2014-03-15 DIAGNOSIS — W19XXXA Unspecified fall, initial encounter: Secondary | ICD-10-CM | POA: Insufficient documentation

## 2014-03-15 DIAGNOSIS — I10 Essential (primary) hypertension: Secondary | ICD-10-CM | POA: Insufficient documentation

## 2014-03-15 DIAGNOSIS — S32401S Unspecified fracture of right acetabulum, sequela: Secondary | ICD-10-CM

## 2014-03-15 DIAGNOSIS — IMO0002 Reserved for concepts with insufficient information to code with codable children: Secondary | ICD-10-CM | POA: Insufficient documentation

## 2014-03-15 DIAGNOSIS — F17200 Nicotine dependence, unspecified, uncomplicated: Secondary | ICD-10-CM | POA: Insufficient documentation

## 2014-03-15 MED ORDER — OXYCODONE-ACETAMINOPHEN 5-325 MG PO TABS
2.0000 | ORAL_TABLET | Freq: Once | ORAL | Status: AC
Start: 2014-03-15 — End: 2014-03-15
  Administered 2014-03-15: 2 via ORAL

## 2014-03-15 MED ORDER — OXYCODONE-ACETAMINOPHEN 5-325 MG PO TABS
ORAL_TABLET | ORAL | Status: AC
Start: 2014-03-15 — End: ?
  Filled 2014-03-15: qty 2

## 2014-03-15 NOTE — ED Notes (Signed)
Patient diagnosed with a nondisplaced acetabular fracture last month. He is supposed to be nonweightbearing. He states he got up to walk to the bathroom today and fell landing on his right knee and "all the force one up into my right hip". He now complains of increased pain in the right hip. Rest with Jamal Collin. He recommends CT. If the fracture is unchanged he recommends discharge to home on pain medication.    Theodora Blow, MD  03/15/14 1700

## 2014-03-15 NOTE — ED Provider Notes (Signed)
[facility] EMERGENCY DEPARTMENT History and Physical Exam      Patient Name: Gary Vazquez,[name]  Encounter Date:  03/15/2014  Attending Physician: Theodora Blow, MD  PCP: Christa See, MD  Patient DOB:  22-Nov-1965  MRN:  [id number]  Room:  RAUD/RAU-D      History of Presenting Illness     Chief complaint: Hip Pain    HPI/ROS is limited by: none  HPI/ROS given by: patient    Location: right hip  Duration: 1 day  Severity: moderate    Gary Proia. is a 48 y.o. male who presents with right hip pain. Patient diagnosed with a nondisplaced acetabular fracture last month. He is supposed to be nonweightbearing. He states he got up to walk to the bathroom today and fell landing on his right knee and "all the force went up into my right hip". He now complains of increased pain in the right hip. Discussed the case with Gary Vazquez. He recommends CT. If the fracture is unchanged he recommends discharge to home on pain medication.    Review of Systems     Review of Systems   Constitutional: Negative for fever and chills.   HENT: Negative for voice change.    Eyes: Negative for photophobia and discharge.   Respiratory: Negative for shortness of breath and stridor.    Cardiovascular: Negative for chest pain and palpitations.   Gastrointestinal: Negative for vomiting and diarrhea.   Genitourinary: Negative for dysuria and difficulty urinating.   Musculoskeletal: Negative for joint swelling, gait problem and neck stiffness.   Skin: Negative for rash and wound.   Neurological: Negative for seizures and syncope.   Hematological: Negative for adenopathy. Does not bruise/bleed easily.   Psychiatric/Behavioral: Negative for hallucinations and agitation.      Allergies     Pt has No Known Allergies.    Medications     Current Outpatient Rx   Name  Route  Sig  Dispense  Refill   . aspirin 325 MG EC tablet    Oral    Take 1 tablet (325 mg total) by mouth daily.    30 tablet    0     . lisinopril (PRINIVIL,ZESTRIL) 10 MG tablet     Oral    Take 1 tablet (10 mg total) by mouth daily.    30 tablet    0     . metoprolol XL (TOPROL-XL) 25 MG 24 hr tablet    Oral    Take 25 mg by mouth daily.             Marland Kitchen oxyCODONE-acetaminophen (ROXICET) 5-325 MG per tablet    Oral    Take 1 tablet by mouth every 4 (four) hours as needed for Pain. MAX 5 TABLETS PER DAY.    50 tablet    0          Past Medical History     Pt has a past medical history of HTN (hypertension); Hyperlipidemia; and Arthritis.    Past Surgical History     Pt has past surgical history that includes Appendectomy; Abdominal surgery; Appendectomy; and spleen surgery.    Family History     The family history includes Diabetes in his father; Heart attack in his father; Heart disease in his father; Hyperlipidemia in his father; Hypertension in his father.    Social History     Pt reports that he has been smoking.  He has never used smokeless tobacco. He reports that  he uses illicit drugs (Marijuana and Other-see comments). He reports that he does not drink alcohol.    Physical Exam     Blood pressure 142/85, pulse 89, temperature 98.1 F (36.7 C), temperature source Oral, resp. rate 17, height 1.88 m, weight 96.163 kg, SpO2 100 %.    Physical Exam   Constitutional: He is oriented to person, place, and time. He appears well-developed and well-nourished. No distress.   HENT:   Head: Atraumatic.   Eyes: Pupils are equal, round, and reactive to light.   Neck: Normal range of motion. Neck supple.   Cardiovascular: Normal rate, regular rhythm, normal heart sounds and intact distal pulses.    Pulmonary/Chest: Effort normal and breath sounds normal.   Abdominal: Soft. There is no tenderness.   Musculoskeletal:   Limited rom in right hip secondary to pain, diffuse tenderness to palpation.  Distally NVI.    Neurological: He is alert and oriented to person, place, and time. No cranial nerve deficit.   Skin: Skin is warm and dry. He is not diaphoretic.   Psychiatric: He has a normal mood and affect.       Orders Placed     Orders Placed This Encounter   Procedures   . CT BONEY PELVIS       Diagnostic Results       The results of the diagnostic studies below have been reviewed by myself:    Labs  Results     ** No results found for the last 24 hours. **          Radiologic Studies  Radiology Results (24 Hour)     Procedure Component Value Units Date/Time    CT BONEY PELVIS [[id number]] Collected:  03/15/14 1716    Order Status:  Completed Updated:  03/15/14 1726    Narrative:      Clinical History:  Recent right acetabular fracture, fall and reinjury today, increased pain    Examination:  CT BONEY PELVIS    Comparison:  February 23, 2014 including CT scan and prior MRI.    Findings:  The undisplaced acetabular fracture seen on MRI is still not visible on CT. Of the femoral head contusion remains occult  as well. No displaced fractures or significant interval changes.    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    Pelvic bones and SI joints are intact. Symphysis within normal limits.    No significant soft tissue abnormalities.      Impression:      Stable CT exam. The known undisplaced fracture of the right acetabulum seen by MRI is not visible on CT.    No displaced fracture or acute findings.    ReadingStation:[id number]          EKG: none    ED Course & Treatment          MDM / Critical Care     Blood pressure 142/85, pulse 89, temperature 98.1 F (36.7 C), temperature source Oral, resp. rate 17, height 1.88 m, weight 96.163 kg, SpO2 100 %.    The differential diagnosis includes, but is not limited to fracture, sprain, strain, contusion, hematoma, laceration, compartment syndrome      This chart was generated by an EMR and may contain errors or omissions not intended by the user.    Procedures     None    Diagnosis / Disposition     Clinical Impression  1. Acetabular fracture, right, sequela  Disposition  ED Disposition     Discharge Gary Reap. discharge to home/self  care.    Condition at disposition: Stable            The documentation recorded by my scribe, Gary Vazquez Herald, accurately reflects the services I personally performed and the decisions made by me. Theodora Blow MD.            Theodora Blow, MD  03/18/14 [phone]

## 2014-03-15 NOTE — Discharge Instructions (Signed)
Remain nonweightbearing on the right leg.

## 2014-03-21 ENCOUNTER — Telehealth (INDEPENDENT_AMBULATORY_CARE_PROVIDER_SITE_OTHER): Payer: Self-pay | Admitting: Orthopaedic Surgery

## 2014-03-21 NOTE — Telephone Encounter (Signed)
PT CALLED AND LEFT A MESSAGE ON THE VOICEMAIL STATING THAT HE NEEDS A MEDICATION REFILL. I CALLED THE PT BACK TO ASK WHAT HE WAS TAKING AND HOW MANY HE HAS LEFT, BUT THERE WAS NO ANSWER.

## 2014-03-22 ENCOUNTER — Telehealth (INDEPENDENT_AMBULATORY_CARE_PROVIDER_SITE_OTHER): Payer: Self-pay | Admitting: Orthopaedic Surgery

## 2014-03-22 NOTE — Telephone Encounter (Signed)
Pt called requesting refill of Oxycodone 5/325mg , has none left.  Last fill 03/10/14 #50 1 q 4h max 5/day.

## 2014-03-24 NOTE — Telephone Encounter (Signed)
LM on VM to call back.  Need to inform pt that a script is ready, AKB decreased pt from Oxycodone to Hydrocodone.

## 2014-03-24 NOTE — Telephone Encounter (Signed)
Spoke to pt & advised of change in medication.  Advised that whomever picks it up is to bring in ID.

## 2014-04-05 ENCOUNTER — Other Ambulatory Visit (INDEPENDENT_AMBULATORY_CARE_PROVIDER_SITE_OTHER): Payer: Self-pay | Admitting: Surgical

## 2014-04-05 ENCOUNTER — Telehealth (INDEPENDENT_AMBULATORY_CARE_PROVIDER_SITE_OTHER): Payer: Self-pay | Admitting: Orthopaedic Surgery

## 2014-04-05 MED ORDER — OXYCODONE-ACETAMINOPHEN 5-325 MG PO TABS
1.0000 | ORAL_TABLET | Freq: Four times a day (QID) | ORAL | Status: DC | PRN
Start: 2014-04-05 — End: 2014-04-11

## 2014-04-05 NOTE — Telephone Encounter (Signed)
Pt LM on VM requesting med refill, states "hydro's" cause itching & he'd like to switch back to the oxycodone.  Hydrocodone script was written on 03/22/14.  DOI: 02/21/14.

## 2014-04-05 NOTE — Telephone Encounter (Signed)
Spoke with pt.  Rx is at the front desk.  He has finished his Norco. glm

## 2014-04-08 ENCOUNTER — Ambulatory Visit (INDEPENDENT_AMBULATORY_CARE_PROVIDER_SITE_OTHER): Payer: Self-pay | Admitting: Orthopaedic Surgery

## 2014-04-11 ENCOUNTER — Ambulatory Visit
Admission: RE | Admit: 2014-04-11 | Discharge: 2014-04-11 | Disposition: A | Discharge status: Home / Self Care | Payer: Self-pay | Source: Ambulatory Visit | Attending: Orthopaedic Surgery | Admitting: Orthopaedic Surgery

## 2014-04-11 ENCOUNTER — Ambulatory Visit (INDEPENDENT_AMBULATORY_CARE_PROVIDER_SITE_OTHER): Payer: Self-pay | Admitting: Surgical

## 2014-04-11 VITALS — Wt 210.0 lb

## 2014-04-11 DIAGNOSIS — IMO0001 Reserved for inherently not codable concepts without codable children: Secondary | ICD-10-CM

## 2014-04-11 DIAGNOSIS — S3289XA Fracture of other parts of pelvis, initial encounter for closed fracture: Secondary | ICD-10-CM

## 2014-04-11 DIAGNOSIS — M1610 Unilateral primary osteoarthritis, unspecified hip: Secondary | ICD-10-CM | POA: Insufficient documentation

## 2014-04-11 DIAGNOSIS — S72009D Fracture of unspecified part of neck of unspecified femur, subsequent encounter for closed fracture with routine healing: Secondary | ICD-10-CM | POA: Insufficient documentation

## 2014-04-11 MED ORDER — OXYCODONE-ACETAMINOPHEN 5-325 MG PO TABS
1.0000 | ORAL_TABLET | Freq: Four times a day (QID) | ORAL | Status: DC | PRN
Start: 2014-04-11 — End: 2014-04-21

## 2014-04-13 ENCOUNTER — Encounter (INDEPENDENT_AMBULATORY_CARE_PROVIDER_SITE_OTHER): Payer: Self-pay | Admitting: Surgical

## 2014-04-13 NOTE — Progress Notes (Signed)
Progress Note    Subjective:    Obe Ahlers. is a 48 y.o. male being seen for Follow-up  .    HPI Pt. Is here today for a regular scheduled appt for a f/u on 02/21/14 right ACE frx.  Overall they are stating that their pain level is 8/10 VAS.  They deny any numbness distally, SOB or CP.  They reports maintaining FWB status.. They are continuing to elevated affected extremity to control edema if needed.  He instructs me that he has been walking on his RLE for the past week.   The following portions of the patient's history were reviewed and updated as appropriate: allergies, current medications, past family history, past medical history, past social history, past surgical history and problem list.    Review of Systems   Constitutional: Negative.        Objective:    Wt 95.255 kg (210 lb)    Physical Exam   Constitutional: He is oriented to person, place, and time. He appears well-developed and well-nourished.   HENT:   Head: Normocephalic and atraumatic.   Eyes: EOM are normal.   Neck: Normal range of motion.   Cardiovascular: Normal rate.    Pulmonary/Chest: Effort normal.   Musculoskeletal:        Right hip: He exhibits decreased range of motion and tenderness. He exhibits normal strength, no bony tenderness and no swelling.   Neurological: He is alert and oriented to person, place, and time. He has normal strength. No sensory deficit.   Skin: Skin is warm and intact. No bruising, no ecchymosis and no rash noted. No cyanosis or erythema. No pallor.   Psychiatric: He has a normal mood and affect.   Nursing note and vitals reviewed.      Assessment:      1. Closed fracture of other specified part of pelvis(808.49)          Plan:     At this time we have reviewed their x-rays and clinical evaluation.  All questions have been answered.  I have discussed all option for their plan of care.  I am refilling their pain meds today and feel that they would benefit from PT/OT.  Their WB status with be NWB RLE.  Pt.  Agrees with plan.       Follow up in: 4weeks

## 2014-04-21 ENCOUNTER — Other Ambulatory Visit: Payer: Self-pay | Admitting: Surgical

## 2014-04-21 ENCOUNTER — Telehealth (INDEPENDENT_AMBULATORY_CARE_PROVIDER_SITE_OTHER): Payer: Self-pay

## 2014-04-21 DIAGNOSIS — S3289XA Fracture of other parts of pelvis, initial encounter for closed fracture: Secondary | ICD-10-CM

## 2014-04-21 MED ORDER — OXYCODONE-ACETAMINOPHEN 5-325 MG PO TABS
1.0000 | ORAL_TABLET | Freq: Four times a day (QID) | ORAL | Status: DC | PRN
Start: 2014-04-21 — End: 2014-05-03

## 2014-04-21 NOTE — Telephone Encounter (Signed)
PATIENT NOTIFIED TO PICK UP RX

## 2014-04-21 NOTE — Telephone Encounter (Signed)
PATIENT CALLED REQUESTING REFILL OF PAIN MEDICATION. PATIENT STATES THAT HE IS GOING OUT OF TOWN TOMORROW AND WILL NOT BE BACK UNTIL 10/5. PATIENT LAST GIVEN PERCOCET 5/325MG  TAKE 1 Q6 MAX 4/DAY #50 ON 04/11/2014

## 2014-05-02 ENCOUNTER — Telehealth (INDEPENDENT_AMBULATORY_CARE_PROVIDER_SITE_OTHER): Payer: Self-pay | Admitting: Orthopaedic Surgery

## 2014-05-02 NOTE — Telephone Encounter (Signed)
Last fill 04/21/14 #50 1 q6h max 4/day. Should have lasted 12 days.

## 2014-05-02 NOTE — Telephone Encounter (Signed)
PT CALLED STATING THAT HE NEEDS A MEDICATION REFILL ON HIS ROXICET 5 MG AND HAS NONE LEFT. PLEASE CALL PT WHEN READY FOR P/U.

## 2014-05-03 ENCOUNTER — Other Ambulatory Visit (INDEPENDENT_AMBULATORY_CARE_PROVIDER_SITE_OTHER): Payer: Self-pay | Admitting: Surgical

## 2014-05-03 ENCOUNTER — Telehealth (INDEPENDENT_AMBULATORY_CARE_PROVIDER_SITE_OTHER): Payer: Self-pay | Admitting: Orthopaedic Surgery

## 2014-05-03 DIAGNOSIS — S32485D Nondisplaced dome fracture of left acetabulum, subsequent encounter for fracture with routine healing: Secondary | ICD-10-CM

## 2014-05-03 MED ORDER — OXYCODONE-ACETAMINOPHEN 5-325 MG PO TABS
1.0000 | ORAL_TABLET | Freq: Three times a day (TID) | ORAL | Status: DC | PRN
Start: 2014-05-03 — End: 2014-06-01

## 2014-05-03 NOTE — Telephone Encounter (Signed)
rx is at the front desk. glm

## 2014-05-03 NOTE — Telephone Encounter (Signed)
Last fill 04/21/14 #50 1 q6h max 4/day. Should have lasted 12 days.                              PT CALLED STATING THAT HE NEEDS A MEDICATION REFILL ON HIS ROXICET 5 MG AND HAS NONE LEFT. PLEASE CALL PT WHEN READY FOR P/U.

## 2014-05-19 ENCOUNTER — Telehealth (INDEPENDENT_AMBULATORY_CARE_PROVIDER_SITE_OTHER): Payer: Self-pay | Admitting: Orthopaedic Surgery

## 2014-05-19 NOTE — Telephone Encounter (Addendum)
Pt LM on VM, recording was very muffled & very difficult to understand. Called pt & he stated "my phone's messed up", still very difficult to hear/understand.  States he has 3 pills left which should get him through the weekend.  Is using them in the morning & at night.  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  Advised AKB/GM are both out of town & while there is someone filling in availability is limited & it may be next week before we can fill.  Pt understood Last fill was Oxycodone 5/325mg  #45 1q8h max 4/day.

## 2014-05-20 ENCOUNTER — Telehealth (INDEPENDENT_AMBULATORY_CARE_PROVIDER_SITE_OTHER): Payer: Self-pay | Admitting: Orthopaedic Surgery

## 2014-05-20 NOTE — Telephone Encounter (Signed)
PT CALLED STATING THAT HE WAS CHECKING ON HIS PAIN MEDS (VERY HARD TO UNDERSTAND AS HE WAS VERY MUFFLED SOUNDING). PER CM, AH HAD SPOKE TO PT YESTERDAY REGARDING BOTH PROVIDERS ARE OUT OF OFFICE AND THAT DR. Manson Passey WOULD BE BACK ON Tuesday IN OFFICE, THAT HE MAY HAVE TO WAIT UNTIL THEN. THE PT SAYS SOMETHING ABOUT GOING THROUGH WITHDRAWALS AND THAT HE WAS OUT OF PAIN MEDS.

## 2014-05-24 ENCOUNTER — Telehealth (INDEPENDENT_AMBULATORY_CARE_PROVIDER_SITE_OTHER): Payer: Self-pay | Admitting: Orthopaedic Surgery

## 2014-05-24 NOTE — Telephone Encounter (Signed)
Spoke with patient & advised that a script is ready & whomever picks it up must bring in ID.  Pt understood.

## 2014-05-24 NOTE — Telephone Encounter (Signed)
Per AKB the prescription for Oxycodone written 05/24/14 must last until his appt on 06/01/14.  It was written on the script that it must last until 06/01/14.

## 2014-06-01 ENCOUNTER — Ambulatory Visit
Admission: RE | Admit: 2014-06-01 | Discharge: 2014-06-01 | Disposition: A | Discharge status: Home / Self Care | Payer: Self-pay | Source: Ambulatory Visit | Attending: Orthopaedic Surgery | Admitting: Orthopaedic Surgery

## 2014-06-01 ENCOUNTER — Encounter (INDEPENDENT_AMBULATORY_CARE_PROVIDER_SITE_OTHER): Payer: Self-pay | Admitting: Orthopaedic Surgery

## 2014-06-01 ENCOUNTER — Ambulatory Visit (INDEPENDENT_AMBULATORY_CARE_PROVIDER_SITE_OTHER): Payer: Self-pay | Admitting: Orthopaedic Surgery

## 2014-06-01 VITALS — Wt 210.0 lb

## 2014-06-01 DIAGNOSIS — S32401A Unspecified fracture of right acetabulum, initial encounter for closed fracture: Secondary | ICD-10-CM

## 2014-06-01 DIAGNOSIS — S32485D Nondisplaced dome fracture of left acetabulum, subsequent encounter for fracture with routine healing: Secondary | ICD-10-CM

## 2014-06-01 DIAGNOSIS — S32451D Displaced transverse fracture of right acetabulum, subsequent encounter for fracture with routine healing: Secondary | ICD-10-CM

## 2014-06-01 DIAGNOSIS — S32401D Unspecified fracture of right acetabulum, subsequent encounter for fracture with routine healing: Secondary | ICD-10-CM | POA: Insufficient documentation

## 2014-06-01 MED ORDER — OXYCODONE-ACETAMINOPHEN 5-325 MG PO TABS
1.0000 | ORAL_TABLET | Freq: Three times a day (TID) | ORAL | Status: DC | PRN
Start: 2014-06-01 — End: 2014-06-19

## 2014-06-01 NOTE — Progress Notes (Signed)
Date of Injury:  02/21/2014 Right occult transverse acetabular fracture sen only on MRI.  Treated non-operatively      Progress Note:  Patient presents for 3 month follow-up regarding above noted surgery.  Patient has been full weight bearing.  His pain has improved, but is still persistent.  He has been on light duty and uses one crutch at times.  He wishes to try and return to full duty.    Physical Exam:  Patient is health-appearing and in no acute distress        Right Lower Extremity -   Neurovascularly intact @ foot                                                                                Hip ROM -  decreased      Radiographs - Xray today shows no evidence of fracture, similar to prior xrays.  (+)DJD right hip    A/P:  48  year old male 3 months s/p right occult transverse acetabular fracture sen only on MRI.  Treated non-operatively. Pain now likely from hip arthritis       1.   WBAT   2.   Return to full duty   3.   Will call for referral to general ortho for management of hip arthitis if no imrovement    4.   Pain Meds renewed    Follow-Up:   prn

## 2014-06-15 ENCOUNTER — Emergency Department
Admission: EM | Admit: 2014-06-15 | Discharge: 2014-06-15 | Disposition: A | Discharge status: Home / Self Care | Payer: Self-pay | Attending: Emergency Medicine | Admitting: Emergency Medicine

## 2014-06-15 ENCOUNTER — Emergency Department: Payer: Self-pay

## 2014-06-15 ENCOUNTER — Encounter: Payer: Self-pay | Admitting: Emergency Medicine

## 2014-06-15 DIAGNOSIS — L0291 Cutaneous abscess, unspecified: Secondary | ICD-10-CM | POA: Insufficient documentation

## 2014-06-15 DIAGNOSIS — E785 Hyperlipidemia, unspecified: Secondary | ICD-10-CM | POA: Insufficient documentation

## 2014-06-15 DIAGNOSIS — I10 Essential (primary) hypertension: Secondary | ICD-10-CM | POA: Insufficient documentation

## 2014-06-15 MED ORDER — CEPHALEXIN 500 MG PO CAPS
500.0000 mg | ORAL_CAPSULE | Freq: Four times a day (QID) | ORAL | Status: AC
Start: 2014-06-15 — End: 2014-06-22

## 2014-06-15 MED ORDER — BUPIVACAINE HCL (PF) 0.75 % IJ SOLN
INTRAMUSCULAR | Status: AC
Start: 2014-06-15 — End: ?
  Filled 2014-06-15: qty 10

## 2014-06-15 MED ORDER — SULFAMETHOXAZOLE-TRIMETHOPRIM 800-160 MG PO TABS
1.0000 | ORAL_TABLET | Freq: Two times a day (BID) | ORAL | Status: AC
Start: 2014-06-15 — End: 2014-06-20

## 2014-06-15 MED ORDER — OXYCODONE-ACETAMINOPHEN 5-325 MG PO TABS
1.0000 | ORAL_TABLET | Freq: Four times a day (QID) | ORAL | Status: DC | PRN
Start: 2014-06-15 — End: 2014-06-19

## 2014-06-15 MED ORDER — MELOXICAM 7.5 MG PO TABS
7.5000 mg | ORAL_TABLET | Freq: Every day | ORAL | Status: DC
Start: 2014-06-15 — End: 2014-06-15

## 2014-06-15 MED ORDER — HYDROCODONE-ACETAMINOPHEN 5-325 MG PO TABS
1.0000 | ORAL_TABLET | Freq: Four times a day (QID) | ORAL | Status: DC | PRN
Start: 2014-06-15 — End: 2014-06-15

## 2014-06-15 NOTE — Discharge Instructions (Signed)
Packing Removal for Abscess    An abscess is a pocket of pus that forms under the skin around an infection. Treating an abscess requires making an incision to drain the pus. Packing (a special type of dressing) is placed inside the abscess/wound to help with healing. The doctor just removed or replaced the packing. Be sure to keep all follow-up appointments as the wound may require repacking if it continues to drain. Follow these instructions to help you take care of the wound once you're home.  Home care  You may be given oral or topical antibiotics if the infection has not cleared. If antibiotics are prescribed, takeall of the medicine until told to stop, even if your symptoms improve. You may also be given medications to help with pain. Take these as directed.  Here are some general care guidelines:   Care for your wound as instructed. Always wash your hands with soap and warm water before and after caring for your wound.   Cover your wound with a clean, dry bandage. Remove and change the bandage as instructed. If the bandage becomes wet or dirty, change it as soon as possible.   If your packing was replaced, a small piece of gauze may hang from the wound. This allows fluid to continue draining from the wound. You may need to use an ointment or cream to keep the packing from sticking to the bandage.   Follow the doctor's instructions about showering. Don't bathe in a tub or soak your wound until the doctor says it's ok.   Avoid scratching, rubbing, or picking at your wound.   Check your wound daily for the signs of infection listedbelow.  Follow-up care  Follow up with your health care provider, or as advised. If your packing was replaced, you may need another visitwithin1to3days to remove or replace the packing.  When to seek medical advice  Call your health care provider right awayif any of these occur:   Fever of 100.4F (38C) or higher, or as directed by your health care provider   Signs of  worsening infection. These include pain, warmth, redness, drainage, or a bad smell coming from the wound.   Pain that doesn't get better even withpain medicine   Bleeding from the wound   Increasing redness   Any new problems   2000-2015 [employer] [address], [place] [address]. All rights reserved. This information is not intended as a substitute for professional medical care. Always follow your healthcare professional's instructions.

## 2014-06-15 NOTE — ED Provider Notes (Signed)
Physician/Midlevel provider first contact with patient: 06/15/14 0617         History     Chief Complaint   Patient presents with   . Wound Check     HPI Comments: 48 y/o male pt presents to the ED with c/c of back pain, with a "knot" on his lower back. The boil started developing on Sunday. Pt is diabetic.     Patient is a 48 y.o. male presenting with abscess. The history is provided by the patient. No language interpreter was used.   Abscess  Location:  Torso  Torso abscess location:  Lower back  Abscess quality: painful    Red streaking: no    Progression:  Worsening  Pain details:     Quality:  Sharp    Severity:  Moderate    Timing:  Constant    Progression:  Worsening  Chronicity:  New  Context: diabetes    Associated symptoms: no fever             Past Medical History   Diagnosis Date   . HTN (hypertension)    . Hyperlipidemia    . Arthritis        Past Surgical History   Procedure Laterality Date   . Appendectomy     . Abdominal surgery     . Appendectomy     . Spleen surgery       not splenectomy       Family History   Problem Relation Age of Onset   . Diabetes Father    . Heart attack Father    . Heart disease Father    . Hyperlipidemia Father    . Hypertension Father        Social  History   Substance Use Topics   . Smoking status: Current Every Day Smoker -- 0.50 packs/day for 32 years     Types: Cigarettes   . Smokeless tobacco: Never Used   . Alcohol Use: 0.0 oz/week     0 Not specified per week      Comment: speical occasions       .     No Known Allergies    Discharge Medication List as of 06/15/2014  6:54 AM      CONTINUE these medications which have NOT CHANGED    Details   acetaminophen (TYLENOL) 325 MG tablet Take 650 mg by mouth., Until Discontinued, Historical Med      aspirin 325 MG EC tablet Take 1 tablet (325 mg total) by mouth daily., Starting 03/02/2014, Until Discontinued, Print      lisinopril (PRINIVIL,ZESTRIL) 10 MG tablet Take 1 tablet (10 mg total) by mouth daily., Starting 03/02/2014,  Until Discontinued, Print      metoprolol XL (TOPROL-XL) 25 MG 24 hr tablet Take 25 mg by mouth daily., Until Discontinued, Historical Med      oxyCODONE-acetaminophen (ROXICET) 5-325 MG per tablet Take 1 tablet by mouth every 8 (eight) hours as needed for Pain. MAX 4 per day, Starting 06/01/2014, Until Discontinued, Print              Review of Systems   Constitutional: Negative for fever and chills.   Respiratory: Negative for shortness of breath.    Cardiovascular: Negative for chest pain.   Gastrointestinal: Negative for abdominal pain.   Skin:        Pt has abscess in lower back       Physical Exam    BP: (!) 155/101 mmHg,  Heart Rate: (!) 102, Temp: 97.7 F (36.5 C), Resp Rate: 18, SpO2: 98 %, Weight: 94.7 kg    Physical Exam   Constitutional: He is oriented to person, place, and time. He appears well-developed and well-nourished. No distress.   HENT:   Head: Normocephalic and atraumatic.   Right Ear: External ear normal.   Left Ear: External ear normal.   Nose: Nose normal.   Mouth/Throat: Oropharynx is clear and moist.   Eyes: Conjunctivae and EOM are normal. Pupils are equal, round, and reactive to light.   Pt has markedly reduced vision   Neck: Normal range of motion. Neck supple.   Cardiovascular: Normal rate, regular rhythm, normal heart sounds and intact distal pulses.    Pulmonary/Chest: Effort normal and breath sounds normal. No respiratory distress. He has no wheezes.   Genitourinary:   Pt had pilonidal cyst which was red and swollen   Neurological: He is alert and oriented to person, place, and time.   Skin: He is not diaphoretic. There is erythema (at site of cyst).   Psychiatric: He has a normal mood and affect. His behavior is normal. Thought content normal.   Nursing note and vitals reviewed.      MDM and ED Course     ED Medication Orders     None              MDM  Number of Diagnoses or Management Options  Abscess:   Diagnosis management comments: The differential diagnosis includes, but is not  limited to acute myofascial strain, low back pain (acute, chronic), fracture, contusion, spondylolisthesis, aortic abdominal aneurysm, acute sciatica, acute herniated disc, degenerative disc disease, urinary tract infection/pyelonephritis, kidney stone,malignancy, metastatic disease, acute cord compression, cauda equina syndrome, infection (epidural abscess, osteomyelitis)    Corrie Dandy was my scribe today and assisted me with documentation only.  He was present during the questioning and physical examination of this patient and documented what he observed and was instructed to document.  This note accurately reflects work and decisions made by me.         Amount and/or Complexity of Data Reviewed  Clinical lab tests: ordered and reviewed           Incision/Drainage  Date/Time: 06/15/2014 6:45 AM  Performed by: Myna Hidalgo  Authorized by: Hassell Done C    Consent:     Consent obtained:  Verbal    Consent given by:  Patient  Location:     Type:  Abscess    Location:  Anogenital    Anogenital location:  Pilonidal  Pre-procedure details:     Skin preparation:  Betadine  Anesthesia (see MAR for exact dosages):     Anesthesia method:  Local infiltration    Local anesthetic:  Bupivacaine 0.5% w/o epi (0.75%)  Procedure details:     Complexity:  Simple    Needle aspiration: no      Incision types:  Single straight    Scalpel blade:  11    Wound management:  Probed and deloculated    Drainage:  Bloody and purulent    Drainage amount:  Copious    Wound treatment:  Drain placed    Packing materials:  1/4 in iodoform gauze  Post-procedure details:     Patient tolerance of procedure:  Tolerated well, no immediate complications      Clinical Impression & Disposition     Clinical Impression  Final diagnoses:   Abscess  ED Disposition     Discharge [name] discharge to home/self care.    Condition at disposition: Stable             Discharge Medication List as of 06/15/2014  6:54 AM      START  taking these medications    Details   cephALEXin (KEFLEX) 500 MG capsule Take 1 capsule (500 mg total) by mouth 4 (four) times daily., Starting 06/15/2014, Until Wed 06/22/14, Print      sulfamethoxazole-trimethoprim (BACTRIM DS,SEPTRA DS) 800-160 MG per tablet Take 1 tablet by mouth 2 (two) times daily., Starting 06/15/2014, Until Mon 06/20/14, Print      HYDROcodone-acetaminophen (NORCO) 5-325 MG per tablet Take 1-2 tablets by mouth every 6 (six) hours as needed for Pain., Starting 06/15/2014, Until Discontinued, Print                       Myna Hidalgo, MD  06/15/14 [phone]

## 2014-06-19 ENCOUNTER — Emergency Department: Payer: Self-pay

## 2014-06-19 ENCOUNTER — Emergency Department
Admission: EM | Admit: 2014-06-19 | Discharge: 2014-06-19 | Disposition: A | Discharge status: Home / Self Care | Payer: Self-pay | Attending: Emergency Medicine | Admitting: Emergency Medicine

## 2014-06-19 DIAGNOSIS — L0291 Cutaneous abscess, unspecified: Secondary | ICD-10-CM | POA: Insufficient documentation

## 2014-06-19 DIAGNOSIS — Z5189 Encounter for other specified aftercare: Secondary | ICD-10-CM | POA: Insufficient documentation

## 2014-06-19 HISTORY — DX: Cutaneous abscess, unspecified: L02.91

## 2014-06-19 MED ORDER — OXYCODONE-ACETAMINOPHEN 5-325 MG PO TABS
1.0000 | ORAL_TABLET | Freq: Four times a day (QID) | ORAL | Status: DC | PRN
Start: 2014-06-19 — End: 2014-06-28

## 2014-06-19 NOTE — ED Provider Notes (Signed)
Physician/Midlevel provider first contact with patient: 06/19/14 0700         History     Chief Complaint   Patient presents with   . Wound Check     Patient is a 48 year old male here for a wound recheck.  He had a buttock abscess incised on 06/15/14.  He has localized soreness to the region with some minimal drainage. No fevers or chills. No other complaints      Past Medical History   Diagnosis Date   . HTN (hypertension)    . Hyperlipidemia    . Arthritis    . Abscess        Past Surgical History   Procedure Laterality Date   . Appendectomy     . Abdominal surgery     . Appendectomy     . Spleen surgery       not splenectomy       Family History   Problem Relation Age of Onset   . Diabetes Father    . Heart attack Father    . Heart disease Father    . Hyperlipidemia Father    . Hypertension Father        Social  History   Substance Use Topics   . Smoking status: Current Every Day Smoker -- 0.50 packs/day for 32 years     Types: Cigarettes   . Smokeless tobacco: Never Used   . Alcohol Use: 0.0 oz/week     0 Not specified per week      Comment: speical occasions       .     No Known Allergies    Current/Home Medications    ACETAMINOPHEN (TYLENOL) 325 MG TABLET    Take 650 mg by mouth.    ASPIRIN 325 MG EC TABLET    Take 1 tablet (325 mg total) by mouth daily.    CEPHALEXIN (KEFLEX) 500 MG CAPSULE    Take 1 capsule (500 mg total) by mouth 4 (four) times daily.    LISINOPRIL (PRINIVIL,ZESTRIL) 10 MG TABLET    Take 1 tablet (10 mg total) by mouth daily.    METOPROLOL XL (TOPROL-XL) 25 MG 24 HR TABLET    Take 25 mg by mouth daily.    SULFAMETHOXAZOLE-TRIMETHOPRIM (BACTRIM DS,SEPTRA DS) 800-160 MG PER TABLET    Take 1 tablet by mouth 2 (two) times daily.        Review of Systems   Constitutional: Negative.    Skin: Positive for wound.       Physical Exam    BP: 153/87 mmHg, Heart Rate: 92, Temp: 97.9 F (36.6 C), Resp Rate: 20, SpO2: 100 %, Weight: 95.7 kg     Physical Exam   Constitutional: He appears  well-developed and well-nourished. No distress.   Skin: Skin is warm and dry. He is not diaphoretic. No erythema.   Packing removed from abscess to buttock  Minimal drainage  No evidence cellulitis     Nursing note and vitals reviewed.        MDM and ED Course     ED Medication Orders     None              MDM  Number of Diagnoses or Management Options  Wound check, abscess:   Risk of Complications, Morbidity, and/or Mortality  Presenting problems: low  Management options: low    Patient Progress  Patient progress: stable       Patient follow-up as needed.  Procedures    Clinical Impression & Disposition     Clinical Impression  Final diagnoses:   Wound check, abscess        ED Disposition     Discharge [name] discharge to home/self care.    Condition at disposition: Stable             New Prescriptions    No medications on file                 Blanca Friend, MD  06/19/14 [phone]

## 2014-06-19 NOTE — Discharge Instructions (Signed)
Abscess [Incision & Drainage]  An abscess (sometimes called a "boil") occurs when bacteria get trapped under the skin and begin to grow. Pus forms inside the abscess as the body responds to the bacteria. An abscess can occur with an insect bite, ingrown hair, blocked oil gland, pimple, cyst, or puncture wound.  Treatment of your abscess has required an incision to drain the pus. If the abscess pocket was large, a gauze packing may have been inserted. This will need to be removed and possibly replaced on your next visit. Antibiotics are not required in the treatment of a simple abscess, unless the infection is spreading into the skin around the wound (known as "cellulitis").  Healing of the wound will take about one to two weeks depending on the size of the abscess. Healthy tissue will grow from the bottom and sides of the opening until it seals over.  Home Care:   The wound may drain for the first two days. Cover the wound with a clean dry dressing. If the dressing becomes soaked with blood or pus, change it.   If a gauze packing was placed inside the abscess cavity, you may be advised to remove it yourself. You may do this in the shower. Once the packing is removed, you should wash the area in the shower or bath 3 to 4 times a day, until the skin opening has closed.   If you were prescribed antibiotics, take them as directed until they are all gone.   You may use acetaminophen (Tylenol) or ibuprofen (Motrin, Advil) to control pain, unless another pain medicine was prescribed. [ NOTE: If you have liver disease or ever had a stomach ulcer, talk with your doctor before using these medicines.]  Follow Up  with your doctor as advised by our staff. If a gauze packing was inserted in your wound, it should be removed in 1-2 days. Check your wound every day for the signs of worsening infection listed below.  Get Prompt Medical Attention  if any of the following occur:   Increasing redness or swelling   Red streaks  in the skin leading away from the wound   Increasing local pain or swelling   Continued pus draining from the wound two days after treatment   Fever of 100.4F (38C) or higher, or as directed by your healthcare provider   2000-2015 [employer] [address], [place] [address]. All rights reserved. This information is not intended as a substitute for professional medical care. Always follow your healthcare professional's instructions.

## 2014-06-19 NOTE — ED Notes (Signed)
Pt states he is here for a wound re-check of an abscess on his tailbone area.

## 2014-06-19 NOTE — ED Notes (Signed)
Applied a clean dressing to pt's abscess. Pt tolerated well.

## 2014-06-27 ENCOUNTER — Inpatient Hospital Stay: Payer: Self-pay | Admitting: Certified Registered"

## 2014-06-27 ENCOUNTER — Emergency Department: Payer: Self-pay

## 2014-06-27 ENCOUNTER — Observation Stay: Payer: Self-pay | Admitting: Surgery

## 2014-06-27 ENCOUNTER — Inpatient Hospital Stay: Payer: Self-pay

## 2014-06-27 ENCOUNTER — Inpatient Hospital Stay (HOSPITAL_COMMUNITY): Payer: Self-pay | Admitting: Certified Registered"

## 2014-06-27 ENCOUNTER — Observation Stay
Admission: EM | Admit: 2014-06-27 | Discharge: 2014-06-28 | Disposition: A | Discharge status: Home / Self Care | Payer: Self-pay | Attending: Surgery | Admitting: Surgery

## 2014-06-27 ENCOUNTER — Encounter
Admission: EM | Disposition: A | Discharge status: Home / Self Care | Payer: Self-pay | Source: Home / Self Care | Attending: Emergency Medicine

## 2014-06-27 DIAGNOSIS — Z79899 Other long term (current) drug therapy: Secondary | ICD-10-CM | POA: Insufficient documentation

## 2014-06-27 DIAGNOSIS — Z8249 Family history of ischemic heart disease and other diseases of the circulatory system: Secondary | ICD-10-CM | POA: Insufficient documentation

## 2014-06-27 DIAGNOSIS — K810 Acute cholecystitis: Secondary | ICD-10-CM

## 2014-06-27 DIAGNOSIS — K8020 Calculus of gallbladder without cholecystitis without obstruction: Secondary | ICD-10-CM | POA: Diagnosis present

## 2014-06-27 DIAGNOSIS — K8050 Calculus of bile duct without cholangitis or cholecystitis without obstruction: Secondary | ICD-10-CM | POA: Diagnosis present

## 2014-06-27 DIAGNOSIS — Z9089 Acquired absence of other organs: Secondary | ICD-10-CM | POA: Insufficient documentation

## 2014-06-27 DIAGNOSIS — F1020 Alcohol dependence, uncomplicated: Secondary | ICD-10-CM

## 2014-06-27 DIAGNOSIS — R079 Chest pain, unspecified: Secondary | ICD-10-CM

## 2014-06-27 DIAGNOSIS — Z7982 Long term (current) use of aspirin: Secondary | ICD-10-CM | POA: Insufficient documentation

## 2014-06-27 DIAGNOSIS — K8001 Calculus of gallbladder with acute cholecystitis with obstruction: Secondary | ICD-10-CM

## 2014-06-27 DIAGNOSIS — F17210 Nicotine dependence, cigarettes, uncomplicated: Secondary | ICD-10-CM | POA: Insufficient documentation

## 2014-06-27 DIAGNOSIS — K8010 Calculus of gallbladder with chronic cholecystitis without obstruction: Principal | ICD-10-CM | POA: Insufficient documentation

## 2014-06-27 DIAGNOSIS — M1990 Unspecified osteoarthritis, unspecified site: Secondary | ICD-10-CM | POA: Insufficient documentation

## 2014-06-27 DIAGNOSIS — E785 Hyperlipidemia, unspecified: Secondary | ICD-10-CM | POA: Insufficient documentation

## 2014-06-27 DIAGNOSIS — I10 Essential (primary) hypertension: Secondary | ICD-10-CM | POA: Insufficient documentation

## 2014-06-27 DIAGNOSIS — Z833 Family history of diabetes mellitus: Secondary | ICD-10-CM | POA: Insufficient documentation

## 2014-06-27 HISTORY — PX: LAPAROSCOPIC, CHOLECYSTECTOMY, CHOLANGIOGRAM: SHX4475

## 2014-06-27 LAB — TROPONIN I: Troponin I: 0.01 ng/mL (ref 0.00–0.02)

## 2014-06-27 LAB — COMPREHENSIVE METABOLIC PANEL
ALT: 30 U/L (ref 0–55)
AST (SGOT): 20 U/L (ref 10–42)
Albumin/Globulin Ratio: 1.31 Ratio (ref 0.70–1.50)
Albumin: 4.2 gm/dL (ref 3.5–5.0)
Alkaline Phosphatase: 61 U/L (ref 40–145)
Anion Gap: 12.6 mMol/L (ref 7.0–18.0)
BUN / Creatinine Ratio: 17.6 Ratio (ref 10.0–30.0)
BUN: 15 mg/dL (ref 7–22)
Bilirubin, Total: 0.2 mg/dL (ref 0.1–1.2)
CO2: 28 mMol/L (ref 20.0–30.0)
Calcium: 9.9 mg/dL (ref 8.5–10.5)
Chloride: 107 mMol/L (ref 98–110)
Creatinine: 0.85 mg/dL (ref 0.80–1.30)
EGFR: >60 mL/min/{1.73_m2}
Globulin: 3.2 gm/dL (ref 2.0–4.0)
Glucose: 121 mg/dL — ABNORMAL HIGH (ref 70–99)
Osmolality Calc: 287 mOsm/kg (ref 275–300)
Potassium: 4.6 mMol/L (ref 3.5–5.3)
Protein, Total: 7.4 gm/dL (ref 6.0–8.3)
Sodium: 143 mMol/L (ref 136–147)

## 2014-06-27 LAB — URINALYSIS
Bilirubin, UA: NEGATIVE
Blood, UA: NEGATIVE
Glucose, UA: NEGATIVE mg/dL
Ketones UA: NEGATIVE mg/dL
Leukocyte Esterase, UA: NEGATIVE Leu/uL
Nitrite, UA: NEGATIVE
Protein, UR: NEGATIVE mg/dL
RBC, UA: 1 /hpf (ref 0–4)
Urine Specific Gravity: 1.023 (ref 1.001–1.040)
Urobilinogen, UA: NORMAL mg/dL
WBC, UA: <1 /hpf (ref 0–4)
pH, Urine: 8 pH (ref 5.0–8.0)

## 2014-06-27 LAB — ECG 12-LEAD
P Wave Axis: 0 deg
P Wave Duration: 98 ms
P-R Interval: 150 ms
Patient Age: 48 years
Q-T Dispersion: 44 ms
Q-T Interval(Corrected): 382 ms
Q-T Interval: 382 ms
QRS Axis: 16 deg
QRS Duration: 90 ms
T Axis: 50 deg
Ventricular Rate: 60 /min

## 2014-06-27 LAB — CBC AND DIFFERENTIAL
Basophils %: 0.7 % (ref 0.0–3.0)
Basophils Absolute: 0.1 10*3/uL (ref 0.0–0.3)
Eosinophils %: 1.6 % (ref 0.0–7.0)
Eosinophils Absolute: 0.2 10*3/uL (ref 0.0–0.8)
Hematocrit: 50.3 % (ref 39.0–52.5)
Hemoglobin: 17.4 gm/dL (ref 13.0–17.5)
Lymphocytes Absolute: 2 10*3/uL (ref 0.6–5.1)
Lymphocytes: 17 % (ref 15.0–46.0)
MCH: 33 pg (ref 28–35)
MCHC: 35 gm/dL (ref 32–36)
MCV: 94 fL (ref 80–100)
MPV: 8.1 fL (ref 6.0–10.0)
Monocytes Absolute: 0.9 10*3/uL (ref 0.1–1.7)
Monocytes: 8 % (ref 3.0–15.0)
Neutrophils %: 72.7 % (ref 42.0–78.0)
Neutrophils Absolute: 8.5 10*3/uL (ref 1.7–8.6)
PLT CT: 281 10*3/uL (ref 130–440)
RBC: 5.35 10*6/uL (ref 4.00–5.70)
RDW: 11.9 % (ref 11.0–14.0)
WBC: 11.8 10*3/uL — ABNORMAL HIGH (ref 4.0–11.0)

## 2014-06-27 LAB — LIPASE: Lipase: 61 U/L (ref 8–78)

## 2014-06-27 LAB — ETHANOL: Alcohol: <10 mg/dL (ref 0–9)

## 2014-06-27 SURGERY — LAPAROSCOPIC, CHOLECYSTECTOMY, CHOLANGIOGRAM
Anesthesia: Anesthesia General | Site: Abdomen | Wound class: Clean Contaminated

## 2014-06-27 MED ORDER — DEXAMETHASONE SODIUM PHOSPHATE 4 MG/ML IJ SOLN
INTRAMUSCULAR | Status: DC | PRN
Start: 2014-06-27 — End: 2014-06-27
  Administered 2014-06-27: 4 mg via INTRAVENOUS

## 2014-06-27 MED ORDER — ONDANSETRON HCL 4 MG/2ML IJ SOLN
INTRAMUSCULAR | Status: DC | PRN
Start: 2014-06-27 — End: 2014-06-27
  Administered 2014-06-27: 4 mg via INTRAVENOUS

## 2014-06-27 MED ORDER — MIDAZOLAM HCL 2 MG/2ML IJ SOLN
INTRAMUSCULAR | Status: DC | PRN
Start: 2014-06-27 — End: 2014-06-27
  Administered 2014-06-27: 2 mg via INTRAVENOUS

## 2014-06-27 MED ORDER — LIDOCAINE VISCOUS 2 % MT SOLN
OROMUCOSAL | Status: AC
Start: 2014-06-27 — End: ?
  Filled 2014-06-27: qty 15

## 2014-06-27 MED ORDER — LIDOCAINE HCL (PF) 2 % IJ SOLN
INTRAMUSCULAR | Status: DC | PRN
Start: 2014-06-27 — End: 2014-06-27
  Administered 2014-06-27: 80 mg via INTRAVENOUS

## 2014-06-27 MED ORDER — VH HYDROMORPHONE HCL PF 1 MG/ML CARPUJECT
1.0000 mg | Freq: Once | INTRAMUSCULAR | Status: AC
Start: 2014-06-27 — End: 2014-06-27
  Administered 2014-06-27: 1 mg via INTRAVENOUS

## 2014-06-27 MED ORDER — INFLUENZA VAC SPLIT QUAD 0.5 ML IM SUSY
0.5000 mL | PREFILLED_SYRINGE | INTRAMUSCULAR | Status: DC | PRN
Start: 2014-06-27 — End: 2014-06-28
  Filled 2014-06-27: qty 0.5

## 2014-06-27 MED ORDER — FENTANYL CITRATE 0.05 MG/ML IJ SOLN
INTRAMUSCULAR | Status: AC
Start: 2014-06-27 — End: ?
  Filled 2014-06-27: qty 5

## 2014-06-27 MED ORDER — METOPROLOL TARTRATE 1 MG/ML IV SOLN
INTRAVENOUS | Status: AC
Start: 2014-06-27 — End: ?
  Filled 2014-06-27: qty 5

## 2014-06-27 MED ORDER — LACTATED RINGERS IV SOLN
INTRAVENOUS | Status: DC
Start: 2014-06-27 — End: 2014-06-27

## 2014-06-27 MED ORDER — GLYCOPYRROLATE 0.2 MG/ML IJ SOLN
INTRAMUSCULAR | Status: DC | PRN
Start: 2014-06-27 — End: 2014-06-27
  Administered 2014-06-27: 0.4 mg via INTRAVENOUS

## 2014-06-27 MED ORDER — LIDOCAINE VISCOUS 2 % MT SOLN
Freq: Once | OROMUCOSAL | Status: AC
Start: 2014-06-27 — End: 2014-06-27

## 2014-06-27 MED ORDER — NEOSTIGMINE METHYLSULFATE 0.5 MG/ML IJ SOLN
INTRAMUSCULAR | Status: DC | PRN
Start: 2014-06-27 — End: 2014-06-27
  Administered 2014-06-27: 3 mg via INTRAVENOUS

## 2014-06-27 MED ORDER — MIDAZOLAM HCL 2 MG/2ML IJ SOLN
INTRAMUSCULAR | Status: AC
Start: 2014-06-27 — End: ?
  Filled 2014-06-27: qty 2

## 2014-06-27 MED ORDER — FENTANYL CITRATE 0.05 MG/ML IJ SOLN
INTRAMUSCULAR | Status: AC
Start: 2014-06-27 — End: ?
  Filled 2014-06-27: qty 2

## 2014-06-27 MED ORDER — ROCURONIUM BROMIDE 50 MG/5ML IV SOLN
INTRAVENOUS | Status: DC | PRN
Start: 2014-06-27 — End: 2014-06-27
  Administered 2014-06-27: 10 mg via INTRAVENOUS
  Administered 2014-06-27: 50 mg via INTRAVENOUS

## 2014-06-27 MED ORDER — ROCURONIUM BROMIDE 50 MG/5ML IV SOLN
INTRAVENOUS | Status: AC
Start: 2014-06-27 — End: ?
  Filled 2014-06-27: qty 5

## 2014-06-27 MED ORDER — NEOSTIGMINE METHYLSULFATE 0.5 MG/ML IJ SOLN
INTRAMUSCULAR | Status: AC
Start: 2014-06-27 — End: ?
  Filled 2014-06-27: qty 10

## 2014-06-27 MED ORDER — PROPOFOL 10 MG/ML IV EMUL
INTRAVENOUS | Status: AC
Start: 2014-06-27 — End: ?
  Filled 2014-06-27: qty 20

## 2014-06-27 MED ORDER — BUPIVACAINE-EPINEPHRINE (PF) 0.5% -1:200000 IJ SOLN
INTRAMUSCULAR | Status: DC | PRN
Start: 2014-06-27 — End: 2014-06-27
  Administered 2014-06-27: 8 mL via INTRAMUSCULAR
  Administered 2014-06-27: 22 mL via INTRAMUSCULAR

## 2014-06-27 MED ORDER — ONDANSETRON HCL 4 MG/2ML IJ SOLN
INTRAMUSCULAR | Status: AC
Start: 2014-06-27 — End: ?
  Filled 2014-06-27: qty 2

## 2014-06-27 MED ORDER — ALUM & MAG HYDROXIDE-SIMETH 200-200-20 MG/5ML PO SUSP
ORAL | Status: AC
Start: 2014-06-27 — End: ?
  Filled 2014-06-27: qty 30

## 2014-06-27 MED ORDER — ENOXAPARIN SODIUM 40 MG/0.4ML SC SOLN
40.0000 mg | Freq: Every day | SUBCUTANEOUS | Status: DC
Start: 2014-06-27 — End: 2014-06-28
  Administered 2014-06-27: 40 mg via SUBCUTANEOUS
  Filled 2014-06-27 (×2): qty 0.4

## 2014-06-27 MED ORDER — PROPOFOL 10 MG/ML IV EMUL
INTRAVENOUS | Status: DC | PRN
Start: 2014-06-27 — End: 2014-06-27
  Administered 2014-06-27: 200 mg via INTRAVENOUS

## 2014-06-27 MED ORDER — LISINOPRIL 10 MG PO TABS
10.0000 mg | ORAL_TABLET | Freq: Every day | ORAL | Status: DC
Start: 2014-06-27 — End: 2014-06-28
  Administered 2014-06-27: 10 mg via ORAL
  Filled 2014-06-27 (×2): qty 1

## 2014-06-27 MED ORDER — OXYCODONE-ACETAMINOPHEN 5-325 MG PO TABS
1.0000 | ORAL_TABLET | ORAL | Status: DC | PRN
Start: 2014-06-27 — End: 2014-06-28
  Administered 2014-06-27 – 2014-06-28 (×4): 2 via ORAL
  Filled 2014-06-27 (×4): qty 2

## 2014-06-27 MED ORDER — VH HYDROMORPHONE HCL PF 1 MG/ML CARPUJECT
0.5000 mg | INTRAMUSCULAR | Status: DC | PRN
Start: 2014-06-27 — End: 2014-06-27
  Administered 2014-06-27: 0.5 mg via INTRAVENOUS
  Filled 2014-06-27: qty 1

## 2014-06-27 MED ORDER — VH PHENYLEPHRINE 40 MCG/ML IV BOLUS (ANESTHESIA)
PREFILLED_SYRINGE | INTRAVENOUS | Status: DC | PRN
Start: 2014-06-27 — End: 2014-06-27
  Administered 2014-06-27 (×4): 80 ug via INTRAVENOUS

## 2014-06-27 MED ORDER — GLYCOPYRROLATE 0.4 MG/2ML IJ SOLN
INTRAMUSCULAR | Status: AC
Start: 2014-06-27 — End: ?
  Filled 2014-06-27: qty 2

## 2014-06-27 MED ORDER — ACETAMINOPHEN 325 MG PO TABS
650.0000 mg | ORAL_TABLET | ORAL | Status: DC | PRN
Start: 2014-06-27 — End: 2014-06-28

## 2014-06-27 MED ORDER — VALLEY PROMETHAZINE 50 MG/0.4 ML TOPICAL GEL UD (RPKG)
25.0000 mg | Freq: Once | TOPICAL | Status: DC | PRN
Start: 2014-06-27 — End: 2014-06-27

## 2014-06-27 MED ORDER — FENTANYL CITRATE 0.05 MG/ML IJ SOLN
50.0000 ug | INTRAMUSCULAR | Status: DC | PRN
Start: 2014-06-27 — End: 2014-06-27
  Administered 2014-06-27 (×2): 50 ug via INTRAVENOUS
  Filled 2014-06-27: qty 2

## 2014-06-27 MED ORDER — HYDROMORPHONE HCL 2 MG/ML IJ SOLN
0.5000 mg | INTRAMUSCULAR | Status: DC | PRN
Start: 2014-06-27 — End: 2014-06-27
  Administered 2014-06-27 (×2): 0.5 mg via INTRAVENOUS

## 2014-06-27 MED ORDER — METOPROLOL SUCCINATE ER 25 MG PO TB24
25.0000 mg | ORAL_TABLET | Freq: Every day | ORAL | Status: DC
Start: 2014-06-27 — End: 2014-06-28
  Administered 2014-06-27: 25 mg via ORAL
  Filled 2014-06-27 (×2): qty 1

## 2014-06-27 MED ORDER — VH HYDROMORPHONE HCL 1 MG/ML (NARRATOR)
INTRAMUSCULAR | Status: AC
Start: 2014-06-27 — End: ?
  Filled 2014-06-27: qty 1

## 2014-06-27 MED ORDER — KETOROLAC TROMETHAMINE 30 MG/ML IJ SOLN
30.0000 mg | Freq: Four times a day (QID) | INTRAMUSCULAR | Status: DC
Start: 2014-06-27 — End: 2014-06-28
  Administered 2014-06-27 – 2014-06-28 (×3): 30 mg via INTRAVENOUS
  Filled 2014-06-27 (×11): qty 1

## 2014-06-27 MED ORDER — IOTHALAMATE MEGLUMINE 60 % IJ SOLN
INTRAMUSCULAR | Status: DC | PRN
Start: 2014-06-27 — End: 2014-06-27
  Administered 2014-06-27: 5 mL via INTRAMUSCULAR

## 2014-06-27 MED ORDER — KCL IN DEXTROSE-NACL 20-5-0.45 MEQ/L-%-% IV SOLN
INTRAVENOUS | Status: DC
Start: 2014-06-27 — End: 2014-06-27
  Filled 2014-06-27 (×4): qty 1000

## 2014-06-27 MED ORDER — ONDANSETRON HCL 4 MG/2ML IJ SOLN
4.0000 mg | Freq: Four times a day (QID) | INTRAMUSCULAR | Status: DC | PRN
Start: 2014-06-27 — End: 2014-06-28

## 2014-06-27 MED ORDER — PB-HYOSCY-ATROPINE-SCOPOLAMINE 16.2 MG/5ML PO ELIX
ORAL_SOLUTION | ORAL | Status: AC
Start: 2014-06-27 — End: ?
  Filled 2014-06-27: qty 10

## 2014-06-27 MED ORDER — NICOTINE 7 MG/24HR TD PT24
1.0000 | MEDICATED_PATCH | Freq: Every day | TRANSDERMAL | Status: DC
Start: 2014-06-27 — End: 2014-06-28
  Administered 2014-06-27: 1 via TRANSDERMAL
  Filled 2014-06-27 (×2): qty 1

## 2014-06-27 MED ORDER — CEFAZOLIN SODIUM-DEXTROSE 2-3 GM-% IV SOLR
2.0000 g | Freq: Three times a day (TID) | INTRAVENOUS | Status: AC
Start: 2014-06-27 — End: 2014-06-28
  Administered 2014-06-27 – 2014-06-28 (×2): 2 g via INTRAVENOUS
  Filled 2014-06-27 (×2): qty 50

## 2014-06-27 MED ORDER — ONDANSETRON HCL 4 MG/2ML IJ SOLN
4.0000 mg | Freq: Once | INTRAMUSCULAR | Status: AC
Start: 2014-06-27 — End: 2014-06-27
  Administered 2014-06-27: 4 mg via INTRAVENOUS

## 2014-06-27 MED ORDER — IOTHALAMATE MEGLUMINE 60 % IJ SOLN
INTRAMUSCULAR | Status: AC
Start: 2014-06-27 — End: ?
  Filled 2014-06-27: qty 30

## 2014-06-27 MED ORDER — METOPROLOL TARTRATE 1 MG/ML IV SOLN
INTRAVENOUS | Status: DC | PRN
Start: 2014-06-27 — End: 2014-06-27
  Administered 2014-06-27 (×3): 1 mg via INTRAVENOUS

## 2014-06-27 MED ORDER — LACTATED RINGERS IV SOLN
INTRAVENOUS | Status: DC
Start: 2014-06-27 — End: 2014-06-28

## 2014-06-27 MED ORDER — DEXAMETHASONE SODIUM PHOSPHATE 4 MG/ML IJ SOLN
INTRAMUSCULAR | Status: AC
Start: 2014-06-27 — End: ?
  Filled 2014-06-27: qty 1

## 2014-06-27 MED ORDER — FENTANYL CITRATE 0.05 MG/ML IJ SOLN
INTRAMUSCULAR | Status: DC | PRN
Start: 2014-06-27 — End: 2014-06-27
  Administered 2014-06-27: 100 ug via INTRAVENOUS
  Administered 2014-06-27 (×4): 50 ug via INTRAVENOUS

## 2014-06-27 MED ORDER — SODIUM CHLORIDE 0.9 % IJ SOLN
0.4000 mg | INTRAMUSCULAR | Status: DC | PRN
Start: 2014-06-27 — End: 2014-06-28

## 2014-06-27 SURGICAL SUPPLY — 34 items
ADHESIVE DERMABOND HIVIC PEN (Supply) ×2 IMPLANT
APPLICATOR COTTONTIP STER 6 (Supply) ×2 IMPLANT
CANNISTER 1200CC W/LOCK LID (Supply) ×2 IMPLANT
CANNISTER SUCTION 3000C (Supply) ×2 IMPLANT
CATH IV MTL HUB 14GA X 2IN (Supply) ×2 IMPLANT
CATH OPEN END URETERAL 4 FR (TDC (Tubes, Draines, Catheters)) ×1
CATH OPEN END URETERAL 4 FR (TDC (Tubes, Drains, Catheters)) ×1 IMPLANT
CHLORAPREP W/ORANGE TINT 26ML (Supply) ×2 IMPLANT
CORD DISPOSABLE CAUTERY 10FT (Supply) ×2 IMPLANT
COVER LIGHT HANDLE (ORTHO) (Supply) ×4 IMPLANT
DRAPE C-ARM X-RAY (Supply) ×2 IMPLANT
DRAPE Z-FRICTION (Supply) ×2 IMPLANT
GOWN STD LG W/TOWEL REUSE (Supply) ×4 IMPLANT
HEMOCLIP MED/LARGE (Supply) ×2 IMPLANT
HOOK L  60-5163-004 (Supply) ×2 IMPLANT
NDL VERRES 120MM (Supply) ×2 IMPLANT
PACK ABDOMINAL LAP CDS760057 (Supply) ×2 IMPLANT
PAD ARMBOARD 20 X 8 (Supply) ×2 IMPLANT
SLEEVE 5MM Z-THREAD (Supply) ×2 IMPLANT
SOL SALINE .9% 1000ML  LF (Solutions) ×2 IMPLANT
SOL SALINE IRRIG 500ML (Solutions) ×2 IMPLANT
SOL WATER STERILE IRRG 1000ML (Solutions) ×2 IMPLANT
SUT VICRYL 0 J112T (Supply) ×2 IMPLANT
SUT VICRYL 0 J603H (Supply) IMPLANT
SUT VICRYL 4-0 J496H (Supply) ×4 IMPLANT
SYRINGE 30CC LL WIDE FLANGE (Supply) ×4 IMPLANT
SYSTEM INZII RETRIEVAL (Supply) ×2 IMPLANT
TOWEL (FAN-FOLD) 6/PK (Supply) ×4 IMPLANT
TROCAR 11MMX100MM KII THREADED (Supply) ×4 IMPLANT
TROCAR 5MM BLADED Z-THREAD (Supply) IMPLANT
TROCAR 5MM NB FIOS Z-THREAD (Supply) ×2 IMPLANT
TUBING INSUFFLAT (Supply) ×2 IMPLANT
TUBING IRRIG SINGLE TUBE SET (Supply) ×2 IMPLANT
UNDERPAD BLUE 23X36  LF (Supply) ×4 IMPLANT

## 2014-06-27 NOTE — Progress Notes (Signed)
Received a call from pharmacist asking if pt received pre-op dose of cefazolin. Informed pharmacist that patient came back from surgery on my shift so if it was administered then it was done so by day-shift nurse. I got in contact with previous shift nurse and was informed that the pre-op dose is given one hour prior to incision downstairs in the pre-op holding area so it was not to be administered by her. Called pharmacist back and informed them of this information. Also informed them that there was no record of Cefazolin being ordered after reviewing order history.

## 2014-06-27 NOTE — OR PreOp (Signed)
Received into anesthesia procedural area.

## 2014-06-27 NOTE — Progress Notes (Addendum)
Pt leaving at this time via wheelchair to go to room 466. Will have surgery later today. Pt aware and ok with plan of care.

## 2014-06-27 NOTE — Anesthesia Postprocedure Evaluation (Signed)
Anesthesia Post Evaluation    Patient: Gary Vazquez.    Procedures performed: Procedure(s):  LAPAROSCOPIC, CHOLECYSTECTOMY, CHOLANGIOGRAM    Anesthesia type: General ETT    Patient location:PACU    Last vitals:   Filed Vitals:    06/27/14 1830   BP: 133/75   Pulse: 77   Temp:    Resp: 11   SpO2: 96%       Post pain: Patient not complaining of pain, continue current therapy      Mental Status:awake and alert     Respiratory Function: tolerating room air    Cardiovascular: stable    Nausea/Vomiting: patient not complaining of nausea or vomiting    Hydration Status: adequate    Post assessment: no apparent anesthetic complications

## 2014-06-27 NOTE — H&P (Signed)
HISTORY AND PHYSICAL - ACCESS   Name:  Gary Vazquez,Gary Vazquez.                 MR#:  [id number]               Date:  06/27/2014       IMPRESSION:   48 y.o. male  with biliary colic.  Nontoxic.     Present on Admission:   . Cholelithiasis     PLAN:   Admit to Surgical floor.  OR for lap chole     Leory Plowman, MD  ACCESS [phone] or [phone]       CC:  Patient complains of upper abdominal pain.      HPI:   48 y.o. male  presents with a complaint of abdominal pain located in the epigastric, RUQ. The pain is described as sharp. Onset was 12 hours  ago. Symptoms have been unchanged since. Aggravating factors include activity, movement and pressure.  Alleviating factors include none. Associated symptoms include chills, nausea and vomiting. The patient denies anorexia, constipation, diarrhea, dysuria, fever, hematochezia, hematuria, melena and sweats.  The pain does radiate to L arm and chest.  This resolved after coming to ER overnight.  Had oyster stew overnight.    PMH:   He  has a past medical history of HTN (hypertension); Hyperlipidemia; Arthritis; and Abscess.     PSH:   He  has past surgical history that includes Appendectomy; Abdominal surgery; Appendectomy; and spleen surgery.     Social History:   He  reports that he has been smoking Cigarettes.  He has a 16 pack-year smoking history. He has never used smokeless tobacco. He reports that he drinks alcohol. He reports that he does not use illicit drugs.    Family History:   His family history includes Diabetes in his father; Heart attack in his father; Heart disease in his father; Hyperlipidemia in his father; Hypertension in his father.       Home Medications:     Prior to Admission medications    Medication Sig Start Date End Date Taking? Authorizing Provider   aspirin 325 MG EC tablet Take 1 tablet (325 mg total) by mouth daily. 03/02/14  Yes Merita Norton, NP   lisinopril (PRINIVIL,ZESTRIL) 10 MG tablet Take 1 tablet (10 mg total) by mouth daily. 03/02/14  Yes Merita Norton, NP   metoprolol XL (TOPROL-XL) 25 MG 24 hr tablet Take 25 mg by mouth daily.   Yes [provider]   acetaminophen (TYLENOL) 325 MG tablet Take 650 mg by mouth.    [provider]   oxyCODONE-acetaminophen (PERCOCET) 5-325 MG per tablet Take 1 tablet by mouth every 6 (six) hours as needed for Pain. 06/19/14   Blanca Friend, MD          Allergies:   He has No Known Allergies.     ROS:   Pertinent details as detailed in HPI.    Associated symptoms include chills, nausea and vomiting. The patient denies anorexia, constipation, diarrhea, dysuria, fever, hematochezia, hematuria, melena and sweats.    Physical Exam:   Blood pressure 129/73, pulse 70, temperature 98.6 F (37 C), resp. rate 16, height 1.88 m (6' 2.02"), weight 95.5 kg (210 lb 8.6 oz), SpO2 99 %.   General:  well-nourished, appears uncomfortable due to pain, non-toxic  Chest:  lungs clear to ausculation, equal breath sounds  Cardiac:  regular rate and rhythm without murmurs/rubs/gallops  Abdomen:  active bowel sounds, soft, moderate epigastric and RUQ tenderness without rebound and without guarding, non-distended  Extremities:  no edema and peripheral pulses 2+ and symmetric  Wound:  mildline gluteal cleft I&D site well healed with minimal induration    Labs:      Results     Procedure Component Value Units Date/Time    Urinalysis [[id number]] Collected:  06/27/14 0810    Specimen Information:  Urine / Urine, Random Updated:  06/27/14 0838     Color, UA Yellow      Clarity, UA Clear      Specific Gravity, UR 1.023      pH, Urine 8.0 pH      Protein, UR Negative mg/dL      Glucose, UA Negative mg/dL      Ketones UA Negative mg/dL      Bilirubin, UA Negative      Blood, UA Negative      Nitrite, UA Negative      Urobilinogen, UA Normal mg/dL      Leukocyte Esterase, UA Negative Leu/uL      WBC, UA <1 /hpf      RBC, UA 1 /hpf     Troponin I [[id number]] Collected:  06/27/14 0514    Specimen Information:  Blood / Plasma Updated:   06/27/14 0602     Troponin I 0.01 ng/mL     CMP [[id number]]  (Abnormal) Collected:  06/27/14 0514    Specimen Information:  Blood / Plasma Updated:  06/27/14 0559     Sodium 143 mMol/L      Potassium 4.6 mMol/L      Chloride 107 mMol/L      CO2 28.0 mMol/L      CALCIUM 9.9 mg/dL      Glucose 644 (H) mg/dL      Creatinine 0.34 mg/dL      BUN 15 mg/dL      Protein, Total 7.4 gm/dL      Albumin 4.2 gm/dL      Alkaline Phosphatase 61 U/L      ALT 30 U/L      AST (SGOT) 20 U/L      Bilirubin, Total 0.2 mg/dL      Albumin/Globulin Ratio 1.31 Ratio      Anion Gap 12.6 mMol/L      BUN/Creatinine Ratio 17.6 Ratio      EGFR >60 mL/min/1.39m2      Osmolality Calc 287 mOsm/kg      Globulin 3.2 gm/dL     Lipase [[id number]] Collected:  06/27/14 0514    Specimen Information:  Blood / Plasma Updated:  06/27/14 0559     Lipase 61 U/L     Alcohol Level [[id number]] Collected:  06/27/14 0514    Specimen Information:  Blood / Plasma Updated:  06/27/14 0559     Alcohol <10 mg/dL     CBC [[id number]]  (Abnormal) Collected:  06/27/14 0514    Specimen Information:  Blood / Blood Updated:  06/27/14 0531     WBC 11.8 (H) K/cmm      RBC 5.35 M/cmm      Hemoglobin 17.4 gm/dL      Hematocrit 30.1 %      MCV 94 fL      MCH 33 pg      MCHC 35 gm/dL      RDW 60.1 %      PLT CT 281 K/cmm      MPV 8.1 fL  NEUTROPHIL % 72.7 %      Lymphocytes 17.0 %      Monocytes 8.0 %      Eosinophils % 1.6 %      Basophils % 0.7 %      Neutrophils Absolute 8.5 K/cmm      Lymphocytes Absolute 2.0 K/cmm      Monocytes Absolute 0.9 K/cmm      Eosinophils Absolute 0.2 K/cmm      BASO Absolute 0.1 K/cmm            Radiology:   Xr Chest 2 Views    06/27/2014   Normal chest.  ReadingStation:[email or web address]    US Abdomen Complete    06/27/2014   There is again a nonmobile gallstone in the gallbladder neck, without significant change when compared to the previous exam. There is no gallbladder wall thickening or pericholecystic fluid. There is hepatomegaly and nonspecific medical  liver disease.  ReadingStation:[email or web address]

## 2014-06-27 NOTE — ED Notes (Signed)
C/O Right and Left upper abdominal pains radiating to left side of chest. States he ate oyster stew around 8:00pm last night and began hurting around 9:00 pm. He states he has thrown up multiple times began hurting in his chest so he "knew I needed to get up here."

## 2014-06-27 NOTE — Anesthesia Preprocedure Evaluation (Signed)
Anesthesia Evaluation    AIRWAY    Mallampati: III    TM distance: >3 FB  Neck ROM: full  Mouth Opening:full   CARDIOVASCULAR    regular and normal       DENTAL    no notable dental hx     PULMONARY    clear to auscultation     OTHER FINDINGS    Patient is able to achieve >4 METS activity without chest pain or shortness of breath. He has history of HTN, controlled with lisinopril and metoprolol.     Pt is NPO since MN last night.                  Anesthesia Plan    ASA 2     general                     intravenous induction   Detailed anesthesia plan: general endotracheal        Post op pain management: per surgeon    informed consent obtained    Plan discussed with CRNA.    ECG reviewed  pertinent labs reviewed

## 2014-06-27 NOTE — Op Note (Signed)
FULL OPERATIVE NOTE    Date Time: 06/27/2014 6:09 PM  Patient Name: Gary Vazquez,[name]  Attending Physician: Leory Plowman, MD      Date of Operation:   06/27/2014    Providers Performing:   Surgeon(s):  Tyson Alias, MD      Circulator: Rober Minion, RN  Radiology Technologist: Sherene Sires  Relief Circulator: Karna Dupes, RN  Relief Scrub: Candise Bowens, RN; Register, Lupe Carney, CSA  Scrub Person: Carylon Perches  Second Scrub Person: Day, Filomena Jungling    Operative Procedure:   Procedure(s):  LAPAROSCOPIC, CHOLECYSTECTOMY, CHOLANGIOGRAM    Preoperative Diagnosis:   Pre-Op Diagnosis Codes:     * Calculus of gallbladder     Postoperative Diagnosis:   Post-Op Diagnosis Codes:     * Calculus of gallbladder with acute cholecystitis and obstruction [K80.01]    Indications:   48 yo male with acute onset RUQ, epigastric, and left upper quadrant pain that started last night after eating oyster stew.  Pain radiates throughout the abdomen.  Labs demonstrated an elevated WBC count but normal LFT's and normal lipase.  RUQ ultrasound demonstrated stones in the gallbladder but no evidence of acute cholecystitis.  He was, however, exquisitely tender over the upper abdomen.  The risks and benefits of surgical intervention were discussed with the patient who consented to laparoscopic, possible open cholecystectomy.      Operative Notes:   The patient was brought to the operating room and was placed in the supine position on the table.  After the induction of general endotracheal anesthesia, the patient was prepped and draped in the usual sterile fashion.  A timeout was performed confirming correct patient, procedure, site, antibiotics, and thromboprophylaxis.     An supraumbilical incision was made and blunt dissection was used to identify the fascia.  While lifting up on the anterior abdominal wall a Veress needle was passed into the peritoneal cavity and placement verified with the insufflation of CO2 gas.   The  abdomen was insufflated to 15 mmHg and a 10 mm trochar was placed.  The camera was introduced into the abdomen and there was no evidence of any bleeding or injury.  A second 10 mm trochar and two 5 mm trochars were placed into the abdomen under direct laparoscopic vision at the subxiphoid and subcostal positions.  The patient was placed into steep reverse trendelenberg as the fundus of the gallbladder was identified.  This appeared to be decompressed but covered in dense omental adhesions.  These were taken down with a combination of blunt dissection and electrocautery.  This allowed the ability to grasp the gallbladder fundus and elevate it, rotating the liver cephalad    The infundibulum of the gallbladder was cleared of omental adhesions, then grasped and retracted inferolaterally.  Due to the inflammation and adhesions, it was difficult to see any of the structures of the triangle of Calot, therefore omental adhesions were taken down with a combination of blunt dissection and electrocautery.  This allowed some exposure.  The peritoneum was scored and dissection was accomplished with some difficulty until the structures came into view.  The structure that appeared to be the cystic duct was quite narrow and in order to confirm the correct anatomy, I decided to perform a cholangiogram.      A clip was placed at the cystic duct-gallbladder junction.  Hook scissors were used to incise the cystic duct.  The cholangiocatheter was passed through an angiocath through the abdominal was  and introduced into the cystic duct.  An intraoperative cholangiogram with active fluoroscopy and Conray solution was performed.  There was complete filling of the common bile duct and common hepatic duct as well as both hepatic radicals and their more distal branches.  The contrast spilled into the duodenum with a normal distal taper and no filling defects.  The clip was removed followed by removal of the catheter and then two proximal  clips were placed on the cystic duct before it was completely transected with scissors.      Two proximal and one distal clip were placed on the cystic artery and this was transected.  The gallbladder was then removed from the gallbladder fossa with electrocautery.  The gallbladder was placed into an Endocatch and brought out through the subxiphoid incision.  The abdomen was then insufflated and the right upper quadrant and gallbladder fossa were irrigated and suctioned.  Any bleeding areas in the gallbladder fossa were cauterized.  The area was closely inspected and care was taken to ensure that the clips were secure, that there was no evidence of a bile leak and there was good hemostasis.  The trocars were removed under direct vision as the pneumoperitoneum was released with good hemostasis.  The midline fascia of the umbilical incision and the supxiphoid incision were reapproximated with interrupted 0-Vicryl sutures.  The four incision sites were then closed with running subcuticular 4-0 Vicryl sutures.  Steri-strips were applied followed by sterile gauze dressings.      At the conclusion of the case, all sponge, needle, and instrument counts were correct.  The patient tolerated the procedure well and was taken to the PACU for recovery.  I was present for and performed the entire case.      Estimated Blood Loss:    15 mL      Drains:   Drains: no    Specimens:         SPECIMENS (last 24 hours)      Pathology Specimens       06/27/14 1724             Specimen Information    Specimen Testing Required Routine Pathology       Specimen ID  [id number]       Specimen Description gallbladder             Complications:   None    Signed by: Tyson Alias, MD

## 2014-06-27 NOTE — Transfer of Care (Signed)
Anesthesia Transfer of Care Note    Patient: Gary Vazquez.    Last vitals:   Filed Vitals:    06/27/14 1801   BP: 167/90   Pulse: 90   Temp: 36.7 C (98.1 F)   Resp: 16   SpO2: 97%       Oxygen: Nasal Cannula     Mental Status:awake and alert     Airway: Natural    Cardiovascular Status:  stable

## 2014-06-27 NOTE — ED Provider Notes (Signed)
Physician/Midlevel provider first contact with patient: 06/27/14 [facility]              EMERGENCY DEPARTMENT  PHYSICIAN NOTE      Patient Name: Gary Vazquez,[name]  Encounter Date:  06/27/2014  Attending Physician: Ermalene Postin, MD  PCP: Christa See, MD  Patient DOB:  1965-09-06  MRN:  [id number]  Room:  CD36/CD36-A      History of Presenting Illness     Chief complaint: Abdominal Pain; Chest Pain; and Emesis    HPI/ROS is limited by: none  HPI/ROS given by: patient      Gary Reap. is a 48 y.o. male who presents with severe right upper quadrant and epigastric pain for the last 8 hours. The patient states that he had oysters do last evening. Approximately one hour later he started having epigastric discomfort which progressively worsened. The patient describes his pain as severe. He states he had severe vomiting associated with it. He denies fevers, diarrhea. The pain does radiate into the left upper chest. The pain is described sharp. The patient denies urinary symptoms. He states he had similar episode of pain several months back and was advised that he may have gallstones. The patient denies shortness of breath    Review of Systems   Review of Systems   Constitutional: Negative for fever, chills and fatigue.   HENT: Negative for congestion, rhinorrhea and sore throat.    Eyes: Negative for visual disturbance.   Respiratory: Negative for shortness of breath.    Cardiovascular: Positive for chest pain.   Gastrointestinal: Positive for nausea, vomiting and abdominal pain.   Genitourinary: Negative for dysuria.   Musculoskeletal: Negative for back pain, arthralgias and neck pain.   Skin: Negative for rash.   Neurological: Negative for headaches.   Psychiatric/Behavioral: Negative for behavioral problems and agitation.      Physical Exam   Blood pressure 129/73, pulse 70, temperature 98.6 F (37 C), resp. rate 16, height 1.88 m, weight 95.5 kg, SpO2 99 %.  The vital signs and the nurses note have been  reviewed by me  Physical Exam   Constitutional: He appears well-developed and well-nourished.   HENT:   Head: Normocephalic and atraumatic.   Eyes: Conjunctivae are normal. Pupils are equal, round, and reactive to light.   Neck: Normal range of motion. Neck supple.   Cardiovascular: Normal rate.    Pulmonary/Chest: Effort normal and breath sounds normal.   Abdominal: Soft. There is tenderness. There is guarding. There is no rebound.       Genitourinary:   Healing abscess at the top of the gluteal cleft. There is a mild area of induration. There is no fluctuance or spontaneous drainage at this time. The area is tender   Musculoskeletal: Normal range of motion.   Neurological: He is alert.   Skin: Skin is warm and dry.   Psychiatric: He has a normal mood and affect. His behavior is normal. Thought content normal.   Nursing note and vitals reviewed.       Allergies     Pt Review of patient's allergies indicates no known allergies.    The patient's allergies were reviewed by the M.D.    Medications     Current Outpatient Rx   Name  Route  Sig  Dispense  Refill   . aspirin 325 MG EC tablet    Oral    Take 1 tablet (325 mg total) by mouth daily.    30 tablet  0     . lisinopril (PRINIVIL,ZESTRIL) 10 MG tablet    Oral    Take 1 tablet (10 mg total) by mouth daily.    30 tablet    0     . metoprolol XL (TOPROL-XL) 25 MG 24 hr tablet    Oral    Take 25 mg by mouth daily.             Marland Kitchen acetaminophen (TYLENOL) 325 MG tablet    Oral    Take 650 mg by mouth.             . oxyCODONE-acetaminophen (PERCOCET) 5-325 MG per tablet    Oral    Take 1 tablet by mouth every 6 (six) hours as needed for Pain.    15 tablet    0          The patient's medications were reviewed by the M.D.  Past Medical History     Pt  Past Medical History   Diagnosis Date   . HTN (hypertension)    . Hyperlipidemia    . Arthritis    . Abscess        The patient's past medical history was reviewed by the M.D.  Past Surgical History     Pt  Past Surgical  History   Procedure Laterality Date   . Appendectomy     . Abdominal surgery     . Appendectomy     . Spleen surgery       not splenectomy       The patient's past surgical history was reviewed by the M.D.    Family History     The   Family History   Problem Relation Age of Onset   . Diabetes Father    . Heart attack Father    . Heart disease Father    . Hyperlipidemia Father    . Hypertension Father        Social History     Pt  History   Substance Use Topics   . Smoking status: Current Every Day Smoker -- 0.50 packs/day for 32 years     Types: Cigarettes   . Smokeless tobacco: Never Used   . Alcohol Use: 0.0 oz/week     0 Not specified per week      Comment: speical occasions       Orders Placed     Orders Placed This Encounter   Procedures   . XR Chest 2 Views   . US Abdomen Complete   . Urinalysis   . CBC   . CMP   . Lipase   . Troponin I   . Alcohol Level   . Ice chips   . ECG 12 lead   . Saline lock IV   . [facility] ED Bed Request       Diagnostic Results       The results of the diagnostic studies below have been reviewed by myself:    Labs  Results     Procedure Component Value Units Date/Time    Urinalysis [[id number]] Collected:  06/27/14 0810    Specimen Information:  Urine / Urine, Random Updated:  06/27/14 0838     Color, UA Yellow      Clarity, UA Clear      Specific Gravity, UR 1.023      pH, Urine 8.0 pH      Protein, UR Negative mg/dL      Glucose, UA Negative mg/dL  Ketones UA Negative mg/dL      Bilirubin, UA Negative      Blood, UA Negative      Nitrite, UA Negative      Urobilinogen, UA Normal mg/dL      Leukocyte Esterase, UA Negative Leu/uL      WBC, UA <1 /hpf      RBC, UA 1 /hpf     Troponin I [[id number]] Collected:  06/27/14 0514    Specimen Information:  Blood / Plasma Updated:  06/27/14 0602     Troponin I 0.01 ng/mL     CMP [[id number]]  (Abnormal) Collected:  06/27/14 0514    Specimen Information:  Blood / Plasma Updated:  06/27/14 0559     Sodium 143 mMol/L      Potassium 4.6 mMol/L       Chloride 107 mMol/L      CO2 28.0 mMol/L      CALCIUM 9.9 mg/dL      Glucose 578 (H) mg/dL      Creatinine 4.69 mg/dL      BUN 15 mg/dL      Protein, Total 7.4 gm/dL      Albumin 4.2 gm/dL      Alkaline Phosphatase 61 U/L      ALT 30 U/L      AST (SGOT) 20 U/L      Bilirubin, Total 0.2 mg/dL      Albumin/Globulin Ratio 1.31 Ratio      Anion Gap 12.6 mMol/L      BUN/Creatinine Ratio 17.6 Ratio      EGFR >60 mL/min/1.18m2      Osmolality Calc 287 mOsm/kg      Globulin 3.2 gm/dL     Lipase [[id number]] Collected:  06/27/14 0514    Specimen Information:  Blood / Plasma Updated:  06/27/14 0559     Lipase 61 U/L     Alcohol Level [[id number]] Collected:  06/27/14 0514    Specimen Information:  Blood / Plasma Updated:  06/27/14 0559     Alcohol <10 mg/dL     CBC [[id number]]  (Abnormal) Collected:  06/27/14 0514    Specimen Information:  Blood / Blood Updated:  06/27/14 0531     WBC 11.8 (H) K/cmm      RBC 5.35 M/cmm      Hemoglobin 17.4 gm/dL      Hematocrit 74.2 %      MCV 94 fL      MCH 33 pg      MCHC 35 gm/dL      RDW 59.5 %      PLT CT 281 K/cmm      MPV 8.1 fL      NEUTROPHIL % 72.7 %      Lymphocytes 17.0 %      Monocytes 8.0 %      Eosinophils % 1.6 %      Basophils % 0.7 %      Neutrophils Absolute 8.5 K/cmm      Lymphocytes Absolute 2.0 K/cmm      Monocytes Absolute 0.9 K/cmm      Eosinophils Absolute 0.2 K/cmm      BASO Absolute 0.1 K/cmm           Radiologic Studies  I have personally reviewed the images myself  Xr Chest 2 Views    06/27/2014   Normal chest.  ReadingStation:[email or web address]    US Abdomen Complete    06/27/2014   There is again a nonmobile gallstone in  the gallbladder neck, without significant change when compared to the previous exam. There is no gallbladder wall thickening or pericholecystic fluid. There is hepatomegaly and nonspecific medical liver disease.  ReadingStation:[email or web address]      MDM / ED COURSE     Blood pressure 129/73, pulse 70, temperature 98.6 F (37 C), resp. rate 16, height 1.88 m, weight  95.5 kg, SpO2 99 %.    I have personally reviewed the patient's past medical records.     The patient presents to the Emergency Department with abdominal pain. Treatment has been initiated in the ER, but the patient has not had significant improvement in symptoms, appears ill enough and/or has illness/findings/co-morbidities that make admission for IV medications, possible surgical consult, and further management the most appropriate disposition. Differential diagnosis has included but is not limited to appendicitis, gall bladder disease, bowel obstruction, colitis, gastroenteritis, urinary tract obstruction, AAA, pancreatitic and hepatitis.  Diagnostic impression and plan were discussed and agreed upon with the patient and/or family.  Results of lab/radiology tests were reviewed and discussed with the patient and/or family. All questions were answered and concerns addressed.  Appropriate consultation was made for admission and further treatment of this patient.    The CAT scan does show an impacted gallbladder neck stone. The patient's pain did improve with 1 dose of hydromorphone however on reexamination he is still tender.    I discussed the case with Dr. Reola Calkins of general surgery who graciously agrees to admit the patient for cholecystectomy. I did notify Dr. Reola Calkins regarding the probable need for antibiotics for treatment of the patient's gluteal abscess    EKG     An EKG was obtained and independently interpreted by me. It shows a normal sinus rhythm with a heart rate of 60. There are no seat changes. There is a normal axis    Diagnosis / Disposition     Clinical Impression  1. Cholelithiasis without cholecystitis        Disposition  ED Disposition     Admit Bed Type: General [8]  Admitting Physician: Leory Plowman [[id number]]  Patient Class: Inpatient [101]            Prescriptions  New Prescriptions    No medications on file       Note:  This chart was generated by the Epic EMR system/ speech recognition and may  contain inherent errors, including typographical, or omissions not intended by the user     Ermalene Postin, MD          Ermalene Postin, MD  06/27/14 [phone]

## 2014-06-27 NOTE — ED Notes (Signed)
Report given to CDU, patient is to go to room 36

## 2014-06-27 NOTE — Progress Notes (Signed)
Pt for surgery today per Dr Reola Calkins. Reports some anxiety and stress in his life right now. Feels nervous about surgery. Reassured pt that he will be well taken care of. Advised that he could have ice chips, but nothing else. Declines at this time. Medicated with Dilaudid 0.5mg  IV for pain per VO Dr Reola Calkins. Pain level down to about 3-4/10.  VSS.

## 2014-06-27 NOTE — Progress Notes (Signed)
Patient arrived to room 466 from ED.

## 2014-06-28 DIAGNOSIS — Z09 Encounter for follow-up examination after completed treatment for conditions other than malignant neoplasm: Secondary | ICD-10-CM

## 2014-06-28 MED ORDER — OXYCODONE-ACETAMINOPHEN 5-325 MG PO TABS
1.0000 | ORAL_TABLET | ORAL | Status: DC | PRN
Start: 2014-06-28 — End: 2014-07-05

## 2014-06-28 MED ORDER — ALUM & MAG HYDROXIDE-SIMETH 200-200-20 MG/5ML PO SUSP
30.0000 mL | Freq: Four times a day (QID) | ORAL | Status: DC | PRN
Start: 2014-06-28 — End: 2014-06-28

## 2014-06-28 MED ORDER — ACETAMINOPHEN 325 MG PO TABS
650.0000 mg | ORAL_TABLET | Freq: Four times a day (QID) | ORAL | Status: DC | PRN
Start: 2014-06-28 — End: 2014-08-27

## 2014-06-28 NOTE — Progress Notes (Signed)
Patient given discharge instructions. Patient given scripts. Patient denies any questions/needs. Patient refused transport to main lobby.

## 2014-06-28 NOTE — Discharge Instructions (Signed)
Diet:  Resume previous home diet as tolerated.  No restrictions.    Activity:  No strenuous activity or lifting greater than 10-15 pounds x 2 weeks.  Encourage ambulation as tolerated.  May use stairs if needed.  No working or driving while on narcotic pain medications.    Wound Care:  If Dermabond present, you may shower over this and pat dry after 24 hours.    Instructions:  Call/Return if temperature over 101.0, redness or purulent (pus) drainage or warmth to your incision(s), refractory nausea/vomiting, increased pain not relieved by medications, or new onset of chest pain or shortness or breath.  Call/Return if temperature over 101.0, refractory nausea/vomiting, increased pain not relieved by medications, or new onset of chest pain or shortness or breath.  You may add over-the-counter non-steroidal medications (NSAIDs) such as Ibuprofen or Naproxen as needed for pain control.  Do NOT take additional Tylenol/Acetaminophen.  Use over-the-counter stool softeners and/or Senokot daily to twice daily as needed for constipation.    ACCESS BOWEL MAINTENANCE GUIDELINES    The use of narcotic pain medications and decreased activity will lead to constipation.  In order to prevent this from happening to you, please follow these recommendations:  1. Drink adequate amounts of fluid throughout the day.    2. Take an over-the-counter stool softener, such as Colace, once or twice a day depending on the firmness of your stools.  3. You may take another over-the-counter stool softener, such as Senokot or generic Senna, two tablets at bedtime.  4. Add Metamucil or other similar fiber supplement to your diet twice a day.    If constipation persists:   1. Add Milk of Magnesia two (2) tablespoons (30mL) every 6    hours (up to 8 doses).  2. If you are experiencing rectal fullness or pressure, try a Dulcolax and/or   glycerin suppository every 12 hours.    . Avoid anti-diarrhea medications once your bowels begin to move.  Loose          stools should resolve on their own after 2-3 days.  . Gas relief medications such as Gas-X or Gaviscon are acceptable for use.    . Probiotic food products, such as yogurt, are acceptable especially if you are or have been on antibiotics.    Additional Recipe for Success (Natural Stool Softener):    -1 cup of unsweetened applesauce,   - cup of unsweetened prune juice,   -1 cup of unprocessed bran (ex., [employer]'s brand - can be found at  [employer], [employer],        [place], or other health food stores).    Mix together and store in refrigerator.    Take 2 tablespoons with a large glass of water daily and expect results 6-8 hours later.  You may add 1 tablespoon every 5-7 days if needed.   *can be used as long as desired, non-addictive, & gentle     Contact the ACCESS Office at [phone] if you have additional questions.           Cholecystectomy  You've had painful attacks caused by gallstones. To treat the problem, your doctor wants to remove your gallbladder. This surgery is called cholecystectomy. Removing the gallbladder can relieve pain. It will also prevent future attacks. You can live a healthy life without your gallbladder. You may also be able to go back to eating foods you enjoyed before your gallbladder problems started.    Before Your Surgery   Tell your  provider what medications you take. Include those bought over the counter. Also include herbs or supplements. Be sure to mention if you take prescription blood thinners. This includes warfarin, clopidogrel, and aspirin.   Have any tests your provider asks for, such as blood tests.   Don't eat or drink after midnight, the night before your surgery. This includes water, coffee, and mints. However, youmay need to take some medication with sips of water -- consult your doctor.  The Day of Surgery  When you arrive, you will prepare for surgery:   An IV line will be put into a vein in your arm or hand. This gives you fluids and medication.   An  anesthesiologist will talk with you about anesthesia. This is medication used to prevent pain. You will receive general anesthesia. This puts you into a state like deep sleep through the procedure.  During Surgery  There are2 methods for removing the gallbladder. Your doctor will choose which method isbest for you:   Laparoscopic cholecystectomy. This is most common. During surgery, 2 to 4 small incisions are made. A thin tube with a camera is used. This is called a laparoscope. The scope is put through one of the incisions. It sends images to a video screen. Surgical tools are put through other incisions. The gallbladder is removed using the scope and these tools.   Open cholecystectomy. One larger incision is made. The surgeon sees and works through this incision. Open surgery is most often used when scarring or other factors make it a better choice for you.  In some cases, safety requires a change from laparoscopic to open surgery during the procedure.  After Surgery  You will be sent to a room to wake up from the anesthesia. You will likely go home the same day. In some cases, an overnight stay is needed. If you had open cholecystectomy, you may need to stay in the hospital for a few days.When you are released to go home, have a family member or friend ready to drive you.     [address] [employer] [address] [place], [place] [address]. All rights reserved. This information is not intended as a substitute for professional medical care. Always follow your healthcare professional's instructions.

## 2014-06-28 NOTE — Progress Notes (Signed)
Pt has been resting throughout night. Denies n/v. Complains of pain ,managed with po Percocet. Lap sites x4 c/d/i closed with dermabond. Voiding adequate. Up in room ad lib. IVF infusing. Denies any other needs at this time.

## 2014-06-28 NOTE — Discharge Summary (Signed)
DISCHARGE SUMMARY - ACCESS   Name:  Frisinger,[name]     DOB:  1966/04/16     MR#:  [id number]         Admit Date:  06/27/2014 TO D/C Date: 06/28/2014 [facility] Day: 2 )    ACCESS Surgery Service     Discharge Diagnoses:     Active Hospital Problems    Diagnosis POA   . Biliary colic Yes   . Cholelithiasis Yes      Resolved Hospital Problems    Diagnosis POA   No resolved problems to display.        PMH:   has a past medical history of HTN (hypertension); Hyperlipidemia; Arthritis; and Abscess.     HPI:  Refer to H&P for details.  48 y.o. male presents with a complaint of abdominal pain located in the epigastric, RUQ. The pain is described as sharp. Onset was 12 hours  ago. Symptoms have been unchanged since. Aggravating factors include activity, movement and pressure. Alleviating factors include none. Associated symptoms include chills, nausea and vomiting. The patient denies anorexia, constipation, diarrhea, dysuria, fever, hematochezia, hematuria, melena and sweats. The pain does radiate to L arm and chest. This resolved after coming to ER overnight. Had oyster stew overnight.    Hospital Course:    Admitted to surgical floor and taken to OR for lap chole with IOC.  Post operatively his level continued to be elevated and was kept in the hospital overnight for pain control.  He has now tolerated diet readvancement, his vitals are stable, his pain is controlled with oral analgesics, he is mobilizing independently and stable for discharge to home.    Operative and Other Procedures:   Procedure(s):  LAPAROSCOPIC, CHOLECYSTECTOMY, CHOLANGIOGRAM - Dr Wonda Olds 11/30    Consultations:  None    Condition:  Good    Discharge Meds:     Current Discharge Medication List      CONTINUE these medications which have CHANGED    Details   acetaminophen (TYLENOL) 325 MG tablet Take 2 tablets (650 mg total) by mouth every 6 (six) hours as needed for Pain. Do not take with Percocet  Qty: 30 tablet, Refills: 0       oxyCODONE-acetaminophen (PERCOCET) 5-325 MG per tablet Take 1-2 tablets by mouth every 4 (four) hours as needed.  Qty: 30 tablet, Refills: 0         CONTINUE these medications which have NOT CHANGED    Details   aspirin 325 MG EC tablet Take 1 tablet (325 mg total) by mouth daily.  Qty: 30 tablet, Refills: 0    Associated Diagnoses: Acetabular fracture, right, sequela      lisinopril (PRINIVIL,ZESTRIL) 10 MG tablet Take 1 tablet (10 mg total) by mouth daily.  Qty: 30 tablet, Refills: 0    Associated Diagnoses: Essential hypertension      metoprolol XL (TOPROL-XL) 25 MG 24 hr tablet Take 25 mg by mouth daily.              Follow-up:     Future Appointments  Date Time Provider Department Center   07/05/2014 3:00 PM ACCESS SURGERY PROVIDER [facility] Thamas Jaegers C       Patient Instructions:                              Diet:  Resume previous home diet as tolerated.  No restrictions.    Activity:  No strenuous  activity or lifting greater than 10-15 pounds x 2 weeks.  Encourage ambulation as tolerated.  May use stairs if needed.  No working or driving while on narcotic pain medications.    Wound Care:  If Dermabond present, you may shower over this and pat dry after 24 hours.    Instructions:  Call/Return if temperature over 101.0, redness or purulent (pus) drainage or warmth to your incision(s), refractory nausea/vomiting, increased pain not relieved by medications, or new onset of chest pain or shortness or breath.  Call/Return if temperature over 101.0, refractory nausea/vomiting, increased pain not relieved by medications, or new onset of chest pain or shortness or breath.  You may add over-the-counter non-steroidal medications (NSAIDs) such as Ibuprofen or Naproxen as needed for pain control.  Do NOT take additional Tylenol/Acetaminophen.  Use over-the-counter stool softeners and/or Senokot daily to twice daily as needed for constipation.    Theotis Burrow, NP   [facility] SURGERY

## 2014-06-28 NOTE — Progress Notes (Signed)
ACCESS PROGRESS NOTE    Date Time: 06/28/2014 8:44 AM  Patient Name: Gary Vazquez,[name]      Assessment:   S/P: lap chole with IOC  POD #: 1    Active Problems:    Cholelithiasis    Biliary colic      Plan:   Regular diet  Pain control with oral analgesics  D/C IVF  D/C home    Subjective:   Abdominal pain, gas pain in upper back, passing flatus, tolerated breakfast (bacon)    Medications:     Current Facility-Administered Medications   Medication Dose Route Frequency   . enoxaparin  40 mg Subcutaneous Daily   . ketorolac  30 mg Intravenous 4 times per day   . lisinopril  10 mg Oral Daily   . metoprolol XL  25 mg Oral Daily   . nicotine  1 patch Transdermal Daily       DVT Prophylaxis:   LMWH    GI Prophylaxis   Not indicated    Bowel Regimen:   Bowel movement: No  Bowel regimen: No    Urinary Catheter:   No    Review of Systems:     Negative for - chills, fever, diarrhea, nausea and vomiting  Positive for - tolerating diet and abdominal pain    Physical Exam:     Filed Vitals:    06/28/14 0721   BP: 124/89   Pulse: 75   Temp: 98.1 F (36.7 C)   Resp: 16   SpO2: 96%       Intake and Output Summary (Last 24 hours) at Date Time    Intake/Output Summary (Last 24 hours) at 06/28/14 0844  Last data filed at 06/28/14 1610   Gross per 24 hour   Intake 2109.7 ml   Output    265 ml   Net 1844.7 ml       General:  well-nourished, in no apparent distress, appears uncomfortable due to pain, non-toxic  Chest:  lungs clear to ausculation, equal breath sounds, unlabored respirations, no rales/rhonchi/wheezes  Cardiac:  regular rate and rhythm without murmurs/rubs/gallops  Abdomen:  active bowel sounds, soft, moderate epigastric, RUQ and LUQ tenderness without rebound and without guarding, non-distended, incision is healing well without erythema or drainage and Dermabond is present  Neuro:  alert, speech normal, oriented x 3, no focal neurologic deficits, moves all extremities well, no involuntary movements  Psych:  mood and  affect are within normal limits, insight good, no memory problems were noted, cooperative and calm      Labs:       Recent Labs  Lab 06/27/14  0514   SODIUM 143   POTASSIUM 4.6   CHLORIDE 107   CO2 28.0   BUN 15   CREATININE 0.85   GLUCOSE 121*   CALCIUM 9.9         Recent Labs  Lab 06/27/14  0514   ALT 30   AST (SGOT) 20   BILIRUBIN, TOTAL 0.2   ALBUMIN 4.2   ALKALINE PHOSPHATASE 61         Recent Labs  Lab 06/27/14  0514   WBC 11.8*   RBC 5.35   HEMOGLOBIN 17.4   HEMATOCRIT 50.3   MCV 94   PLATELETS 281       No results for input(s): INR, PT, PTT in the last 168 hours.         Rads:   Radiological Procedure reviewed.  Fl Fluoro < 1  Hour    06/27/2014   Portable C-arm fluoroscopy used in OR for intraoperative cholangiogram performed by Dr. Terrill Mohr.  2 AP fluoroscopic spot images of the right upper quadrant are submitted. The submitted images show a normal intraoperative cholangiogram performed as part of a laparoscopic cholecystectomy.  ReadingStation:[id number]    Signed by: Theotis Burrow, NP; 06/28/2014 8:44 AM

## 2014-06-28 NOTE — Progress Notes (Signed)
Pt admitted to room 466 from PACU at 1930. A&O. Denies n/v. Complains of 5/10 abd pain. Reg HR, lungs clear and diminished on room air. Hypo BS, no bowel movement this shift. Pt up to BR to void. Lap sites x4 c/d/i, closed with dermabond. Placed on diet, tolerated very well. IVF infusing. Denies any other needs at this time. Oriented to room. Call bell within reach. Will cont to monitor pt status.

## 2014-06-29 ENCOUNTER — Encounter: Payer: Self-pay | Admitting: Surgery

## 2014-07-05 ENCOUNTER — Ambulatory Visit (INDEPENDENT_AMBULATORY_CARE_PROVIDER_SITE_OTHER): Payer: Self-pay | Admitting: Surgery

## 2014-07-05 ENCOUNTER — Encounter (INDEPENDENT_AMBULATORY_CARE_PROVIDER_SITE_OTHER): Payer: Self-pay

## 2014-07-05 ENCOUNTER — Encounter (INDEPENDENT_AMBULATORY_CARE_PROVIDER_SITE_OTHER): Payer: Self-pay | Admitting: Surgery

## 2014-07-05 VITALS — BP 144/96 | HR 108 | Temp 97.4°F

## 2014-07-05 DIAGNOSIS — Z9049 Acquired absence of other specified parts of digestive tract: Secondary | ICD-10-CM

## 2014-07-05 DIAGNOSIS — Z09 Encounter for follow-up examination after completed treatment for conditions other than malignant neoplasm: Secondary | ICD-10-CM

## 2014-07-05 DIAGNOSIS — T798XXA Other early complications of trauma, initial encounter: Secondary | ICD-10-CM

## 2014-07-05 MED ORDER — OXYCODONE-ACETAMINOPHEN 5-325 MG PO TABS
1.0000 | ORAL_TABLET | Freq: Four times a day (QID) | ORAL | Status: DC | PRN
Start: 2014-07-05 — End: 2014-08-27

## 2014-07-05 MED ORDER — CEPHALEXIN 500 MG PO CAPS
500.0000 mg | ORAL_CAPSULE | Freq: Three times a day (TID) | ORAL | Status: AC
Start: 2014-07-05 — End: 2014-07-12

## 2014-07-05 NOTE — Progress Notes (Signed)
[facility] Note-Cholecystectomy  Date Time: 07/05/2014 1:23 PM  Patient Name: [name], [name]  DoB: [birth year]  Age: 48 y.o.   PCP: Christa See, MD     History of Present Illness:   Gary Vazquez. 47 y.o. male is s/p laparoscopic cholecystectomy on 06/27/14 by Dr. Wonda Olds. He denies fever, nausea, vomiting, constipation. His appetite has been good. His activity is decreased with some fatigue. He is still using narcotic pain medications but minimally. He also complains of diarrhea, abdominal pain, redness around incision, and abdominal distention.    Medications:     Prior to Admission medications    Medication Sig Start Date End Date Taking? Authorizing Provider   acetaminophen (TYLENOL) 325 MG tablet Take 2 tablets (650 mg total) by mouth every 6 (six) hours as needed for Pain. Do not take with Percocet 06/28/14   Theotis Burrow, NP   aspirin 325 MG EC tablet Take 1 tablet (325 mg total) by mouth daily. 03/02/14   Merita Norton, NP   lisinopril (PRINIVIL,ZESTRIL) 10 MG tablet Take 1 tablet (10 mg total) by mouth daily. 03/02/14   Merita Norton, NP   metoprolol XL (TOPROL-XL) 25 MG 24 hr tablet Take 25 mg by mouth daily.    [provider]   oxyCODONE-acetaminophen (PERCOCET) 5-325 MG per tablet Take 1-2 tablets by mouth every 4 (four) hours as needed. 06/28/14   Theotis Burrow, NP        Review of Systems:   Negative except for noted above.    Physical Exam:   BP 144/96 mmHg  Pulse 108  Temp(Src) 97.4 F (36.3 C) (Oral)    EYES: anicteric  LUNGS: bilaterally clear to auscultation, no rales or wheezes or rhonchi  HEART: regular rate and rhythm, no murmur  ABD: soft, moderate periumbilical and midline upper quadrant tenderness with guarding and erythema, bowel sounds present, incision clean, dry and intact, no drainage, mild abdominal distention  SKIN: not jaundiced    Pathology:   GALLBLADDER, CHOLECYSTECTOMY:  -CHRONIC CHOLECYSTITIS.  -CHOLELITHIASIS.    Assessment:     1.  S/P laparoscopic cholecystectomy    2. Wound infection, initial encounter         Plan:   Return if symptoms worsen or fail to improve.   No orders of the defined types were placed in this encounter.      There are no Patient Instructions on file for this visit.      Provided with Keflex for possible wound infection and pain medication- educated this would be his last refill of pain meds.    Theotis Burrow, NP; 07/05/2014 1:23 PM    The supervising physician for this clinic visit was Dr. Raoul Pitch.         I have reviewed this patient's care with the Nurse Practitioner and agree.    Almon Hercules, MD

## 2014-07-06 ENCOUNTER — Telehealth (INDEPENDENT_AMBULATORY_CARE_PROVIDER_SITE_OTHER): Payer: Self-pay

## 2014-07-06 NOTE — Telephone Encounter (Signed)
Gary MULLINS MA  Pt WAS SEEN IN OFFICE TUES 07/05/14 WAS PAINFUL AND SOME REDNESS AROUND INCISION. PER CARA BLAND PLEASE CALL PATIENT Monday 07/11/14 SEE HOW PATIENT IS DOING. IS PAIN AND REDNESS AROUND INCISION IMPROVING? ANY OTHER SIGNS OF INFECTION? IF PATIENT STILL PAINFUL OR SIGNS OF INFECTION HAVE HIM COME IN TO BE SEEN 07/12/14.

## 2014-07-11 ENCOUNTER — Telehealth (INDEPENDENT_AMBULATORY_CARE_PROVIDER_SITE_OTHER): Payer: Self-pay

## 2014-07-11 NOTE — Telephone Encounter (Signed)
PATIENT'S PHONE DISCONNECTED. HE COULD NOT BE REACHED TO CHECK ON STATUS OF INFECTION.

## 2014-07-11 NOTE — Telephone Encounter (Signed)
Attempted to call patient on all numbers listed in chart. Bother the patient and his emergency contacts numbers are disconnected.

## 2014-07-12 NOTE — Telephone Encounter (Signed)
69- Called patient contact number and wife's phone number.  Both are not currently in service.  Left message on voicemail for father, Gary Vazquez, that we were trying to get in contact with patient to follow up on his condition and to please call our office.   1308- Received call back from patient's nephew who stated he would contact patient and have him call office.  Provided with office phone number.

## 2014-07-13 ENCOUNTER — Telehealth (INDEPENDENT_AMBULATORY_CARE_PROVIDER_SITE_OTHER): Payer: Self-pay

## 2014-07-13 ENCOUNTER — Encounter (HOSPITAL_COMMUNITY): Payer: Self-pay | Admitting: Family Medicine

## 2014-07-13 ENCOUNTER — Emergency Department (HOSPITAL_COMMUNITY): Payer: Self-pay

## 2014-07-13 ENCOUNTER — Emergency Department (HOSPITAL_COMMUNITY)
Admission: EM | Admit: 2014-07-13 | Discharge: 2014-07-13 | Disposition: A | Discharge status: Home / Self Care | Payer: Self-pay | Attending: Emergency Medicine | Admitting: Emergency Medicine

## 2014-07-13 DIAGNOSIS — Z720 Tobacco use: Secondary | ICD-10-CM | POA: Insufficient documentation

## 2014-07-13 DIAGNOSIS — I10 Essential (primary) hypertension: Secondary | ICD-10-CM | POA: Insufficient documentation

## 2014-07-13 DIAGNOSIS — Z79899 Other long term (current) drug therapy: Secondary | ICD-10-CM | POA: Insufficient documentation

## 2014-07-13 DIAGNOSIS — R1010 Upper abdominal pain, unspecified: Secondary | ICD-10-CM | POA: Insufficient documentation

## 2014-07-13 DIAGNOSIS — Z7982 Long term (current) use of aspirin: Secondary | ICD-10-CM | POA: Insufficient documentation

## 2014-07-13 DIAGNOSIS — F419 Anxiety disorder, unspecified: Secondary | ICD-10-CM | POA: Insufficient documentation

## 2014-07-13 HISTORY — DX: Essential (primary) hypertension: I10

## 2014-07-13 LAB — CBC WITH DIFFERENTIAL/PLATELET
Basophils Absolute: 0.1 10*3/uL (ref 0.0–0.1)
Basophils Relative: 1 % (ref 0–1)
Eosinophils Absolute: 0.2 10*3/uL (ref 0.0–0.7)
Eosinophils Relative: 2 % (ref 0–5)
HCT: 50.3 % (ref 39.0–52.0)
Hemoglobin: 17.3 g/dL — ABNORMAL HIGH (ref 13.0–17.0)
Lymphocytes Relative: 29 % (ref 12–46)
Lymphs Abs: 2.2 10*3/uL (ref 0.7–4.0)
MCH: 32.4 pg (ref 26.0–34.0)
MCHC: 34.4 g/dL (ref 30.0–36.0)
MCV: 94.2 fL (ref 78.0–100.0)
Monocytes Absolute: 0.6 10*3/uL (ref 0.1–1.0)
Monocytes Relative: 8 % (ref 3–12)
NEUTROS PCT: 60 % (ref 43–77)
Neutro Abs: 4.5 10*3/uL (ref 1.7–7.7)
PLATELETS: 248 10*3/uL (ref 150–400)
RBC: 5.34 MIL/uL (ref 4.22–5.81)
RDW: 12.9 % (ref 11.5–15.5)
WBC: 7.5 10*3/uL (ref 4.0–10.5)

## 2014-07-13 LAB — LIPASE, BLOOD: Lipase: 65 U/L — ABNORMAL HIGH (ref 11–59)

## 2014-07-13 LAB — COMPREHENSIVE METABOLIC PANEL
ALBUMIN: 4.3 g/dL (ref 3.5–5.2)
ALK PHOS: 69 U/L (ref 39–117)
ALT: 25 U/L (ref 0–53)
ANION GAP: 14 (ref 5–15)
AST: 24 U/L (ref 0–37)
BUN: 13 mg/dL (ref 6–23)
CO2: 23 mEq/L (ref 19–32)
Calcium: 9.7 mg/dL (ref 8.4–10.5)
Chloride: 101 mEq/L (ref 96–112)
Creatinine, Ser: 0.73 mg/dL (ref 0.50–1.35)
GFR calc Af Amer: >90 mL/min (ref >90)
GFR calc non Af Amer: >90 mL/min (ref >90)
Glucose, Bld: 101 mg/dL — ABNORMAL HIGH (ref 70–99)
POTASSIUM: 4.5 meq/L (ref 3.7–5.3)
SODIUM: 138 meq/L (ref 137–147)
Total Bilirubin: 0.3 mg/dL (ref 0.3–1.2)
Total Protein: 7.8 g/dL (ref 6.0–8.3)

## 2014-07-13 MED ORDER — OXYCODONE-ACETAMINOPHEN 5-325 MG PO TABS: 1.0000 | ORAL_TABLET | Freq: Four times a day (QID) | ORAL | Status: AC | PRN

## 2014-07-13 MED ORDER — HYDROMORPHONE HCL 1 MG/ML IJ SOLN
1.0000 mg | Freq: Once | INTRAMUSCULAR | Status: AC
  Administered 2014-07-13: 1 mg via INTRAVENOUS
  Filled 2014-07-13: qty 1

## 2014-07-13 MED ORDER — MORPHINE SULFATE 4 MG/ML IJ SOLN
4.0000 mg | Freq: Once | INTRAMUSCULAR | Status: AC
  Administered 2014-07-13: 4 mg via INTRAVENOUS
  Filled 2014-07-13: qty 1

## 2014-07-13 MED ORDER — IOHEXOL 300 MG/ML  SOLN
25.0000 mL | INTRAMUSCULAR | Status: AC
  Administered 2014-07-13: 25 mL via ORAL

## 2014-07-13 MED ORDER — IOHEXOL 300 MG/ML  SOLN
100.0000 mL | Freq: Once | INTRAMUSCULAR | Status: AC | PRN
  Administered 2014-07-13: 100 mL via INTRAVENOUS

## 2014-07-13 MED ORDER — ONDANSETRON HCL 4 MG/2ML IJ SOLN
4.0000 mg | Freq: Once | INTRAMUSCULAR | Status: AC
  Administered 2014-07-13: 4 mg via INTRAVENOUS
  Filled 2014-07-13: qty 2

## 2014-07-13 NOTE — Telephone Encounter (Signed)
Pt CALLED BACK WITH AN UP DATE- STATES HE IS IN [place] FOR THE HOLIDAYS. HE STARTED YESTERDAY WITH L LOWER/ UMBILICAL PAIN AND TERRIBLE SWEATS. HE HAD LOADED SEVERAL (GREATER THAN 15LBS)BOXES INTO HIS CAR BEFORE LEAVING FOR [place]. WHILE DRIVING TO [place] ABOUT HALF WAY THERE HE STARTED WITH " SEVERE  PAIN AND SWEATS". STILL HAS PAIN AND SWEATS THIS AM AND HAS BEEN TAKING HIS KEFLEX ,HAS 2 DAYS LEFT .   ADVISED Pt TO GO TO LOCAL ER

## 2014-07-13 NOTE — Discharge Instructions (Signed)

## 2014-07-13 NOTE — ED Provider Notes (Signed)
CSN: [id number]     Arrival date & time 07/13/14  1111 History   First MD Initiated Contact with Patient 07/13/14 1340     Chief Complaint  Patient presents with  . Abdominal Pain     (Consider location/radiation/quality/duration/timing/severity/associated sxs/prior Treatment) HPI Comments: Patient is a 48 yo M PMHx significant for HTN presenting to the ED for upper abdominal pain that began yesterday after lifting some heavy boxes. Patient states that he had his gallbladder removed by [facility] Gil[facility]Carty [facility] [place] three weeks ago. He has been taking Keflex for a skin infection at his surgical incisions that started approximately 1 week ago. He states after lifting boxes he is having pain radiating across from RUQ to LUQ. No modifying factors. No fevers, chills, nausea, vomiting, constipation. He has had diarrhea since starting the keflex.   Patient is a 48 y.o. male presenting with abdominal pain.  Abdominal Pain   Past Medical History  Diagnosis Date  . Hypertension    Past Surgical History  Procedure Laterality Date  . Cholecystectomy    . Appendectomy     History reviewed. No pertinent family history. History  Substance Use Topics  . Smoking status: Current Every Day Smoker  . Smokeless tobacco: Not on file  . Alcohol Use: No    Review of Systems  Gastrointestinal: Positive for abdominal pain.  All other systems reviewed and are negative.     Allergies  Review of patient's allergies indicates no known allergies.  Home Medications   Prior to Admission medications   Medication Sig Start Date End Date Taking? Authorizing Provider  aspirin 325 MG tablet Take 325 mg by mouth daily.   Yes Historical Provider, MD  cephALEXin (KEFLEX) 500 MG capsule Take 500 mg by mouth 3 (three) times daily. 10 day course: 07/05/14-07/14/14 07/05/14 07/14/14 Yes Historical Provider, MD  oxyCODONE-acetaminophen (PERCOCET/ROXICET) 5-325 MG per tablet Take by mouth every 4 (four)  hours as needed for severe pain.   Yes Historical Provider, MD  Sacubitril-Valsartan (ENTRESTO PO) Take 1 tablet by mouth daily.   Yes Historical Provider, MD  Cephalexin (KEFLEX PO) Take by mouth.    Historical Provider, MD   BP 137/85 mmHg  Pulse 56  Temp(Src) 98.3 F (36.8 C)  Resp 16  Ht 6\' 2"  (1.88 m)  Wt 212 lb (96.163 kg)  BMI 27.21 kg/m2  SpO2 97% Physical Exam  Constitutional: He is oriented to person, place, and time. He appears well-developed and well-nourished. No distress.  HENT:  Head: Normocephalic and atraumatic.  Right Ear: External ear normal.  Left Ear: External ear normal.  Nose: Nose normal.  Mouth/Throat: Oropharynx is clear and moist.  Eyes: Conjunctivae are normal.  Neck: Normal range of motion. Neck supple.  Cardiovascular: Normal rate, regular rhythm and normal heart sounds.   Pulmonary/Chest: Effort normal and breath sounds normal. No respiratory distress.  Abdominal: Soft. Bowel sounds are normal. He exhibits no distension. There is tenderness. There is guarding (voluntary). There is no rebound.    Incision sites CDI.   Musculoskeletal: Normal range of motion.  Neurological: He is alert and oriented to person, place, and time.  Skin: Skin is warm and dry. He is not diaphoretic.  Psychiatric: His mood appears anxious.  Nursing note and vitals reviewed.   ED Course  Procedures (including critical care time) Medications  morphine 4 MG/ML injection 4 mg (4 mg Intravenous Given 07/13/14 1406)  ondansetron (ZOFRAN) injection 4 mg (4 mg Intravenous Given 07/13/14 1406)  iohexol (OMNIPAQUE) 300 MG/ML solution 25 mL (25 mLs Oral Contrast Given 07/13/14 1415)  iohexol (OMNIPAQUE) 300 MG/ML solution 100 mL (100 mLs Intravenous Contrast Given 07/13/14 1549)    Labs Review Labs Reviewed  CBC WITH DIFFERENTIAL - Abnormal; Notable for the following:    Hemoglobin 17.3 (*)    All other components within normal limits  COMPREHENSIVE METABOLIC PANEL -  Abnormal; Notable for the following:    Glucose, Bld 101 (*)    All other components within normal limits  LIPASE, BLOOD - Abnormal; Notable for the following:    Lipase 65 (*)    All other components within normal limits    Imaging Review No results found.   EKG Interpretation None      MDM   Final diagnoses:  Pain of upper abdomen    Filed Vitals:   07/13/14 1530  BP: 137/85  Pulse: 56  Temp:   Resp: 16    Afebrile, NAD, non-toxic appearing, AAOx4.  I have reviewed nursing notes, vital signs, and all appropriate lab and imaging results for this patient.  CT abd/pelvis pending at time of shift change. Labs unremarkable. IVF, pain medication, and nausea medications given. If CT scan unremarkable likely plan to d/c home with follow up with his surgeon in [place]. Signed out to Constellation BrandsHeather Laisure PA-C. Patient d/w with Dr. Patria Maneampos, agrees with plan.     Jeannetta EllisJennifer L Sarahmarie Leavey, PA-C 07/13/14 1602  Lyanne CoKevin M Campos, MD 07/13/14 [phone]

## 2014-07-13 NOTE — ED Provider Notes (Signed)
3:51 PM Patient signed out to me at shift change by Milus MallickJennifer Pipenbrink, PA-C.  He had a Cholecystectomy performed three weeks ago.  Yesterday he lifted something heavy and is now having pain across his upper abdomen.  Labs unremarkable aside from a mildly elevated Lipase.  CT ab/pelvis with contrast pending.  Plan is for him to be discharged home if CT is negative.   4:52 PM Reassessed patient.  He reports that his pain is controlled at this time.  Discussed results of the CT scan with him.  Discussed the need to follow up with General Surgery.  Santiago GladHeather Dariel Betzer, PA-C 07/13/14 2205  Tilden FossaElizabeth Rees, MD 07/13/14 2239

## 2014-07-13 NOTE — ED Notes (Signed)
Per pt sts he had his gall bladder removed 3 weeks ago. sts he went back to work and was lifting boxes and now having pain where the incision is and across abdomen. sts he is currently taking abx. Denies N,V.

## 2014-07-13 NOTE — ED Notes (Signed)
CT notified that contrast was finished.

## 2014-08-27 ENCOUNTER — Emergency Department
Admission: EM | Admit: 2014-08-27 | Discharge: 2014-08-27 | Disposition: A | Discharge status: Home / Self Care | Payer: Self-pay | Attending: Emergency Medicine | Admitting: Emergency Medicine

## 2014-08-27 ENCOUNTER — Emergency Department: Payer: Self-pay

## 2014-08-27 DIAGNOSIS — S83412A Sprain of medial collateral ligament of left knee, initial encounter: Secondary | ICD-10-CM | POA: Insufficient documentation

## 2014-08-27 DIAGNOSIS — S83207A Unspecified tear of unspecified meniscus, current injury, left knee, initial encounter: Secondary | ICD-10-CM | POA: Insufficient documentation

## 2014-08-27 DIAGNOSIS — W1840XA Slipping, tripping and stumbling without falling, unspecified, initial encounter: Secondary | ICD-10-CM | POA: Insufficient documentation

## 2014-08-27 MED ORDER — OXYCODONE-ACETAMINOPHEN 5-325 MG PO TABS
1.0000 | ORAL_TABLET | Freq: Once | ORAL | Status: AC
Start: 2014-08-27 — End: 2014-08-27
  Administered 2014-08-27: 1 via ORAL

## 2014-08-27 MED ORDER — OXYCODONE-ACETAMINOPHEN 7.5-325 MG PO TABS
1.0000 | ORAL_TABLET | Freq: Four times a day (QID) | ORAL | Status: DC | PRN
Start: 2014-08-27 — End: 2014-09-14

## 2014-08-27 MED ORDER — OXYCODONE-ACETAMINOPHEN 5-325 MG PO TABS
ORAL_TABLET | ORAL | Status: AC
Start: 2014-08-27 — End: ?
  Filled 2014-08-27: qty 1

## 2014-08-27 NOTE — ED Provider Notes (Signed)
Physician/Midlevel provider first contact with patient: 08/27/14 0842       [facility]   EMERGENCY DEPARTMENT     Patient Name: Gary Vazquez, Gary Vazquez.  Encounter Date:  08/27/2014  Attending Physician: Emeterio Reeve, MD  PCP: None  Patient DOB:  July 15, 1966  MRN:  [id number]  Room:  C12/C12-A   Physician/Midlevel provider first contact with patient: 08/27/14 0842         Diagnosis / Disposition     Clinical Impression  1. Tear meniscus knee, left, initial encounter    2. Sprain of medial collateral ligament of left knee, initial encounter        Disposition  ED Disposition     Discharge Gary Reap. discharge to home/self care.    Condition at disposition: Stable            Prescriptions  Discharge Medication List as of 08/27/2014  3:02 PM      START taking these medications    Details   oxyCODONE-acetaminophen (PERCOCET) 7.5-325 MG per tablet Take 1-2 tablets by mouth every 6 (six) hours as needed for Pain (severe pain)., Starting 08/27/2014, Until Discontinued, Print             Primary Care or main doctor  None    Follow up  Lujean Rave, MD  [address]  [place] [address]  [phone]    In 1 week          Schedule an appointment as soon as possible for a visit in 1 week        History of Presenting Illness     Chief complaint: Knee Pain    HPI/ROS is limited by: none  HPI/ROS given by: patient  Gary Reap. is a 49 y.o. male who presents with twisted left knee while getting out of car and slipping on black ice. Can't extend left knee and it clicks.    Review of Systems   Review of Systems   Constitutional: Negative.    HENT: Negative.    Eyes: Negative.    Respiratory: Negative.    Cardiovascular: Negative.    Gastrointestinal: Negative.         GB removed before X-mas, heartburn   Endocrine: Negative.    Genitourinary: Negative.    Skin: Negative.    Allergic/Immunologic: Negative.    Neurological: Negative.    Hematological: Negative.     Psychiatric/Behavioral: Negative for dysphoric mood.   All other systems reviewed and are negative.     Physical Exam     Blood pressure 155/85, pulse 73, temperature 97.5 F (36.4 C), temperature source Oral, resp. rate 16, height 1.88 m, weight 96.2 kg, SpO2 100 %.    Physical Exam   Constitutional: He is oriented to person, place, and time. He appears distressed.   HENT:   Head: Normocephalic and atraumatic.   Eyes: Conjunctivae and EOM are normal. Pupils are equal, round, and reactive to light.   Neck: Normal range of motion. Neck supple.   Cardiovascular: Normal rate, regular rhythm and normal heart sounds.    Pulmonary/Chest: Effort normal and breath sounds normal.   Abdominal: Soft.   Musculoskeletal: Normal range of motion.   Tender swollen left knee. That ligaments tested intact sleep. Decreased extension.   Neurological: He is alert and oriented to person, place, and time. GCS score is 15.   Skin: Skin is warm and dry.   Psychiatric: Mood, memory, affect and  judgment normal.   Nursing note and vitals reviewed.     Allergies     Pt is allergic to morphine and vicodin.    Medications     Current Outpatient Rx   Name  Route  Sig  Dispense  Refill   . aspirin 325 MG EC tablet    Oral    Take 1 tablet (325 mg total) by mouth daily.    30 tablet    0     . lisinopril (PRINIVIL,ZESTRIL) 10 MG tablet    Oral    Take 1 tablet (10 mg total) by mouth daily.  Patient taking differently: Take 10 mg by mouth daily. Out of medication for approximately 2-3 weeks      30 tablet    0     . metoprolol XL (TOPROL-XL) 25 MG 24 hr tablet    Oral    Take 25 mg by mouth daily. Out of medications, approximately 2-3 weeks               . oxyCODONE-acetaminophen (PERCOCET) 7.5-325 MG per tablet    Oral    Take 1-2 tablets by mouth every 6 (six) hours as needed for Pain (severe pain).    15 tablet    0     . DISCONTD: acetaminophen (TYLENOL) 325 MG tablet    Oral    Take 2 tablets (650 mg total) by mouth every 6 (six) hours as  needed for Pain. Do not take with Percocet    30 tablet    0     . DISCONTD: oxyCODONE-acetaminophen (PERCOCET) 5-325 MG per tablet    Oral    Take 1 tablet by mouth every 6 (six) hours as needed for Pain.    15 tablet    0          Past Medical History     Pt has a past medical history of HTN (hypertension); Hyperlipidemia; Arthritis; and Abscess.    Past Surgical History     Pt has past surgical history that includes Appendectomy; Abdominal surgery; Appendectomy; spleen surgery; and LAPAROSCOPIC, CHOLECYSTECTOMY, CHOLANGIOGRAM (N/A, 06/27/2014).    Family History     The family history includes Diabetes in his father; Heart attack in his father; Heart disease in his father; Hyperlipidemia in his father; Hypertension in his father.    Social History     Pt reports that he has been smoking Cigarettes.  He has a 16 pack-year smoking history. He has never used smokeless tobacco. He reports that he drinks alcohol. He reports that he does not use illicit drugs.    Orders Placed     Orders Placed This Encounter   Procedures   . XR Knee Left 4+ Views   . MRI KNEE LEFT  WO CONTRAST       Diagnostic Results     The results of the diagnostic studies below have been reviewed by myself:    Labs  Results     ** No results found for the last 24 hours. **          Radiologic Studies  Radiology Results (24 Hour)     Procedure Component Value Units Date/Time    MRI KNEE LEFT  WO CONTRAST [[id number]] Collected:  08/27/14 1346    Order Status:  Completed Updated:  08/27/14 1407    Narrative:      Clinical History:  internal derangement 49 year old male with left knee pain and swelling after fall yesterday.  Examination:  MRI of the left knee was performed according to standard departmental protocol.    Comparison:  Left knee x-ray August 27, 2014    Findings:   Bone Marrow: There is a small bone contusion in the posterior corner of the medial tibial plateau. There is no fracture.  Cruciate Ligaments: Anterior and posterior  cruciate ligaments are unremarkable.   Collateral Ligaments: There is a grade 1 sprain of the medial collateral ligament. The medial collateral ligament is  surrounded by fluid. There is no tear of the medial collateral ligament. The lateral collateral ligament and posterior  lateral corner structures are unremarkable.  Menisci:  There is a horizontal tear of the posterior horn of the medial meniscus that extends to the free edge of the  posterior horn of the medial meniscus and extends into the posterior meniscal root. There is no displaced fragment. The  rest of the medial meniscus and the lateral meniscus are unremarkable.  Patella: Quadriceps and patellar tendons and patellar retinaculum appear normal.   Articular Cartilage: In the patellofemoral joint, there is low-grade chondromalacia of the articular cartilage of the  femoral trochanter with a few small foci of abnormal low signal in the articular cartilage on series 6, images 22-25.  In the medial tibiofemoral joint, there is low-grade chondromalacia with a small deep fissure in the weightbearing  articular cartilage of the medial femoral condyle. The fissure measures 3 mm in size on series 8, image 23.  In the lateral tibiofemoral joint, the articular cartilage is unremarkable.  Soft Tissue:  There is a small knee joint effusion and a small Baker's cyst. The small Baker's cyst is above and below  the joint line. The component that is above the joint line measures 0.7 cm AP x 1 cm transverse x 2 cm craniocaudal on  series 3, image 15 and series 6, image 12. The component that is below the joint line measures 2.5 x 1 x 3.5 cm on  series 3, image 39 and series 6, image 11.      Impression:      1.  Horizontal tear of the posterior horn of medial meniscus, grade 1 sprain medial collateral ligament and small bone  contusion in the posterior corner of the medial tibial plateau.  2.  Small knee joint effusion and small Baker's cyst.  3.  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    ReadingStation:[id number]    XR Knee Left 4+ Views [[id number]] Collected:  08/27/14 0704    Order Status:  Completed Updated:  08/27/14 0707    Narrative:      Clinical History:  Slip and fall on ice. Left knee pain. Unable to flex.     Examination:  AP, lateral and both oblique views of the left knee.    Comparison:  None available.      Impression:      No fractures are identified.  No joint space narrowing.  Small to moderate joint effusion.        ReadingStation:[id number]          MDM / Critical Care     Diagnostic Considerations:  Knee sprain, tibial plateau fracture, bone bruise, are all considerations but clinically I was suspicious of a meniscus tear. X-rays negative    ED course:  Previous records reviewed.  D/dx, workup, anticipated clinical course discussed with patient.  Results reviewed with patient.  Patient appreciative of care.  All questions answered.  Patient comfortable with treatment  plan. Initial x-ray was negative for fracture and an MRI and firmness posterior medial meniscus tear as well as a bone bruise and a medial collateral ligament sprain. He has crutches at home.    Consultations:  Patient was referred to Dr. Jesse Fall, [facility] orthopedist. He has recently seen Dr. Montel Culver and they may follow him also.    Critical Care:   None    Procedures     None  Signed by: Emeterio Reeve, MD    Scribe Ulis Rias, MD  08/27/14 [phone]

## 2014-08-27 NOTE — ED Notes (Signed)
Patient was walking and slipped on the ice.  When he did, he fell onto his right elbow and "twisted" his left knee.  Patient states that he has no pain in his right elbow, but he is having pain in his left knee.  He is unable to straighten it and when he walks he states he feels a pop.  No swelling noted to his left knee, + pedal pulses.

## 2014-08-27 NOTE — ED Notes (Signed)
RN gave pt lunchbox and drink at this time.

## 2014-08-27 NOTE — ED Notes (Signed)
MRI screening form completed.

## 2014-08-27 NOTE — Discharge Instructions (Signed)
Treating Medial Collateral Ligament (MCL) Problems     There are two options for treating an MCL injury: nonsurgical and surgical. Nonsurgical treatment is used more often. With either option, rehabilitation will be part of your treatment.  Nonsurgical Treatment  This treatment starts with rest, ice, and elevation. This relieves pain and swelling. In the next stage, you begin exercises designed to increase your knee's range of motion, strength, and flexibility. You may need a brace for weeks after your injury. Using crutches or a brace rests your joint, helping it to heal.      Surgery  Surgery is seldom used to repair an MCL injury, however, sometimes it is advised, especially if another part of your knee is damaged. Open surgery is used to screw, staple, or stitch the MCL back into place. If repair of the original MCL is not possible, an MCL graft may be used. Depending on their location, other knee injuries may be repaired using arthroscopy. With arthroscopy, a tiny camera lets your doctor see inside the joint. Tools inserted through small incisions are used to repair the joint.  After Surgery  Right after surgery, you'll spend a few hours in a recovery unit. Your knee will be bandaged, ice will be applied, and your leg elevated. Depending on the surgery performed,your physical therapy may begin shortly after. A brace and crutches are typically used after surgery. You may have restrictions on weight bearing and activity during your healing process.   [address] [employer] [address], [place], [place] [address]. All rights reserved. This information is not intended as a substitute for professional medical care. Always follow your healthcare professional's instructions.          Knee Sprain, Collateral Ligaments  The knee is a hinge joint supported by four strong ligaments. The two ligaments inside the knee (cruciate ligaments) protect this joint from excess forward and backward movement. The  ligaments on the outside of the joint (collateral ligaments) prevent side-to-side motion.  The medial collateral ligament (MCL) is located on the inner side of the joint; and the lateral collateral ligament (LCL) is on the outer side of the joint.  You have sprained one or both collateral ligaments. A sprain is a tearing of a ligament. The tear may be partial or complete. Diagnosis is made by physical exam. In the case of an acute injury, the knee may be too swollen or painful to examine fully. A more accurate exam can be performed after the initial swelling goes down.  Symptoms of a knee sprain include immediate knee swelling, pain, and difficulty walking.Initial treatment includes resting the joint, splinting to reduce movement of the joint, use of ice to reduce swelling and pain. Non-steroidal anti-inflammatory drugs (NSAIDs), such as ibuprofen, may be prescribed.  Most sprains will heal in one to four weeks.A severe injury can take three to four months to heal and requires rehabilitation exercises. Surgery is usually not required for sprains involving only the collateral ligaments.  Home care  The following guidelines will help you care for your injury at home:   Stay off the injured leg as much as possible until you can walk on it without pain. If you have a lot of pain while walking, crutches, or a walker may be prescribed. (These can be rented or purchased at [employer] and surgical or orthopedic supply stores.) Follow your doctor's advice regarding when to begin bearing weight on that leg.   If you were given a hook-and-loop closure  knee brace, you can remove this to bathe, but leave it in place when walking, sitting, or lying down (unless told otherwise).   Apply an ice pack (ice cubes in a plastic bag, wrapped in a towel) over the injured area for 20 minutes every 1-2 hours the first day. If a hook-and-loop closure knee brace was applied, you can open this to apply the ice pack directly to the  knee. Continue with ice packs 3-4 times a day for the next two days, then as needed for the relief of pain and swelling.   You may use acetaminophen or ibuprofen to control pain, unless another pain medicine was prescribed. If you have chronic liver or kidney disease or ever had a stomach ulcer or GI bleeding, talk with your doctor before using these medicines.  Follow-up care  Follow up with the referral doctor, or as advised by our staff.  Any X-rays you had today don't show any broken bones, breaks, or fractures. Sometimes fractures don't show up on the first X-ray. Bruises and sprains can sometimes hurt as much as a fracture. These injuries can take time to heal completely. If your symptoms don't improve or they get worse, talk with your doctor. You may need a repeat X-ray.  When to seek medical advice  Call your health care provider right awayif any of these occur:   Pain or swelling worsens   Shortness of breath or chest pain   Swelling or redness or pain of the calf or thigh   2000-2015 [employer] [address], [place], [place] [address]. All rights reserved. This information is not intended as a substitute for professional medical care. Always follow your healthcare professional's instructions.

## 2014-09-14 ENCOUNTER — Emergency Department: Payer: Self-pay

## 2014-09-14 ENCOUNTER — Emergency Department
Admission: EM | Admit: 2014-09-14 | Discharge: 2014-09-14 | Disposition: A | Discharge status: Home / Self Care | Payer: Self-pay | Attending: Emergency Medicine | Admitting: Emergency Medicine

## 2014-09-14 DIAGNOSIS — W1839XA Other fall on same level, initial encounter: Secondary | ICD-10-CM | POA: Insufficient documentation

## 2014-09-14 DIAGNOSIS — S7001XA Contusion of right hip, initial encounter: Secondary | ICD-10-CM | POA: Insufficient documentation

## 2014-09-14 DIAGNOSIS — M549 Dorsalgia, unspecified: Secondary | ICD-10-CM | POA: Insufficient documentation

## 2014-09-14 MED ORDER — IBUPROFEN 600 MG PO TABS
600.0000 mg | ORAL_TABLET | Freq: Once | ORAL | Status: AC
Start: 2014-09-14 — End: 2014-09-14
  Administered 2014-09-14: 600 mg via ORAL

## 2014-09-14 MED ORDER — IBUPROFEN 600 MG PO TABS
ORAL_TABLET | ORAL | Status: AC
Start: 2014-09-14 — End: ?
  Filled 2014-09-14: qty 1

## 2014-09-14 MED ORDER — OXYCODONE-ACETAMINOPHEN 5-325 MG PO TABS
1.0000 | ORAL_TABLET | Freq: Four times a day (QID) | ORAL | Status: DC | PRN
Start: 2014-09-14 — End: 2014-11-18

## 2014-09-14 NOTE — ED Provider Notes (Signed)
EMERGENCY DEPARTMENT  PHYSICIAN NOTE      Patient Name: Gary Vazquez,[name]  Encounter Date:  09/14/2014  Attending Physician: Ermalene Postin, MD  PCP: Christa See, MD  Patient DOB:  1965/08/01  MRN:  [id number]  Room:  RAUG/RAU-G      History of Presenting Illness     Chief complaint: Hip Pain    HPI/ROS is limited by: none  HPI/ROS given by: patient      Gary Dusza. is a 49 y.o. male who presents with right hip pain after falling out of his parked [place] two weeks ago. He said he stepped out of the [place], lost his footing and fell onto his right side. Increased pain with walking and rotating his hip. No numbness or tingling. He has a prior history of fractured right hip.     He also injured his left knee at the time of the fall, but has already followed up with an orthopedist for this problem. Pain is described as severe. Patient has been taking ibuprofen for the pain without much improvement denies weakness. States ambulation worsens his pain    Review of Systems   Review of Systems   Constitutional: Negative for fever.   Cardiovascular: Negative for chest pain.   Gastrointestinal: Negative for abdominal pain.   Musculoskeletal: Positive for back pain and joint pain.   Neurological: Negative for headaches.   Psychiatric/Behavioral: The patient is nervous/anxious.      Physical Exam   Blood pressure 133/93, pulse 88, temperature 97.7 F (36.5 C), temperature source Oral, resp. rate 16, height 1.88 m, weight 92.6 kg, SpO2 98 %.  The vital signs and the nurses note have been reviewed by me  Physical Exam   Constitutional: He appears well-developed and well-nourished.   HENT:   Head: Normocephalic and atraumatic.   Eyes: Conjunctivae are normal. Pupils are equal, round, and reactive to light.   Neck: Normal range of motion. Neck supple.   Cardiovascular: Normal rate.    Pulmonary/Chest: Effort normal and breath sounds normal.   Abdominal: Soft. There is no tenderness. There is no rebound and no guarding.    Musculoskeletal:        Right hip: He exhibits decreased range of motion and tenderness. He exhibits no deformity.        Cervical back: He exhibits no tenderness and no bony tenderness.        Lumbar back: He exhibits no tenderness and no bony tenderness.   Neurological: He is alert.   Skin: Skin is warm and dry.   Psychiatric: He has a normal mood and affect. His behavior is normal. Thought content normal.   Nursing note and vitals reviewed.       Allergies      Morphine and Vicodin    The patient's allergies were reviewed by the M.D.    Medications     Current Outpatient Rx   Name  Route  Sig  Dispense  Refill   . aspirin 325 MG EC tablet    Oral    Take 1 tablet (325 mg total) by mouth daily.    30 tablet    0     . lisinopril (PRINIVIL,ZESTRIL) 10 MG tablet    Oral    Take 1 tablet (10 mg total) by mouth daily.  Patient taking differently: Take 10 mg by mouth daily. Out of medication for approximately 2-3 weeks      30 tablet    0     .  metoprolol XL (TOPROL-XL) 25 MG 24 hr tablet    Oral    Take 25 mg by mouth daily. Out of medications, approximately 2-3 weeks               . oxyCODONE-acetaminophen (PERCOCET) 5-325 MG per tablet    Oral    Take 1-2 tablets by mouth every 6 (six) hours as needed for Pain.    15 tablet    0          The patient's medications were reviewed by the M.D.  Past Medical History     Past Medical History   Diagnosis Date   . HTN (hypertension)    . Hyperlipidemia    . Arthritis    . Abscess        The patient's past medical history was reviewed by the M.D.  Past Surgical History     Past Surgical History   Procedure Laterality Date   . Appendectomy     . Abdominal surgery     . Appendectomy     . Spleen surgery       not splenectomy   . Laparoscopic, cholecystectomy, cholangiogram N/A 06/27/2014     Procedure: LAPAROSCOPIC, CHOLECYSTECTOMY, CHOLANGIOGRAM;  Surgeon: Tyson Alias, MD;  Location: [facility];  Service: General;  Laterality: N/A;       The patient's past surgical  history was reviewed by the M.D.    Family History     Family History   Problem Relation Age of Onset   . Diabetes Father    . Heart attack Father    . Heart disease Father    . Hyperlipidemia Father    . Hypertension Father        Social History     History   Substance Use Topics   . Smoking status: Current Every Day Smoker -- 0.50 packs/day for 32 years     Types: Cigarettes   . Smokeless tobacco: Never Used   . Alcohol Use: 0.0 oz/week     0 Not specified per week      Comment: speical occasions       Orders Placed & Medications Given     Orders Placed This Encounter   Procedures   . XR Hip Right AP And Lateral       Medications   ibuprofen (ADVIL,MOTRIN) tablet 600 mg (600 mg Oral Given 09/14/14 1300)       Diagnostic Results       The results of the diagnostic studies below have been reviewed by myself:    Labs  Results     ** No results found for the last 24 hours. **            Radiologic Studies  I have personally reviewed the images myself  Xr Hip Right Ap And Lateral    09/14/2014   1.  No acute fracture or dislocation. 2.  Nondisplaced right acetabular fracture has healed since June 01, 2014. 3.  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  ReadingStation:[id number]        MDM / ED COURSE     Blood pressure 133/93, pulse 88, temperature 97.7 F (36.5 C), temperature source Oral, resp. rate 16, height 1.88 m, weight 92.6 kg, SpO2 98 %.    The differential diagnosis includes, but is not limited to bursitis, tenosynovitis, myofascial strain, l radiculopathy, cellulitis, degenerative arthritis, gout, septic joint, tendonitis, vascular occlusion, DVT    Treatment with NSAIDs and  analgesics. Recommend follow-up with orthopedics as an outpatient   Diagnosis / Disposition     Clinical Impression  1. Contusion of right hip, initial encounter        Disposition  ED Disposition     Discharge [name] discharge to home/self care.    Condition at disposition: Stable            Prescriptions  Discharge  Medication List as of 09/14/2014 12:52 PM      START taking these medications    Details   oxyCODONE-acetaminophen (PERCOCET) 5-325 MG per tablet Take 1-2 tablets by mouth every 6 (six) hours as needed for Pain., Starting 09/14/2014, Until Discontinued, Print             Note:  This chart was generated by the Epic EMR system/ speech recognition and may contain inherent errors, including typographical, or omissions not intended by the user     Ermalene Postin, MD      Pleas Patricia was my scribe today and assisted me with documentation only. She was present during the questioning and physical examination of this patient and documented what she observed and was instructed to document. This note accurately reflects work and decisions made by me. Ermalene Postin M.D.    Ermalene Postin, MD  09/15/14 [phone]

## 2014-09-14 NOTE — Discharge Instructions (Signed)
Hip Contusion    You have a contusion of the hip. This is a bruise with swelling and some bleeding under the skin. There are no fracture (broken bone) seen on the x-ray. This injury takes a few days to a few weeks to heal.  Home Care  1. If walking causes pain, use crutches or a walker until you can walk without pain. These items can be rented at most pharmacies and orthopedic supply stores.  2. Apply an ice pack (ice cubes in a plastic bag, wrapped in a towel) over the injured area for 20 minutes every 1-2 hours the firstday. You should continue with ice packs 3-4 times a day for the next two days. Continue the use of ice packs for relief of pain and swelling as needed.  3. You may use acetaminophen (Tylenol) or ibuprofen (Motrin, Advil) to control pain, unless another pain medicine was prescribed. [NOTE: If you have chronic liver or kidney disease or ever had a stomach ulcer or GI bleeding, talk with your doctor before using these medicines.]  4. If you are not able to get to the bathroom easily or take care of your meal preparation, home health care may be available to provide in-home nursing services. Check with your doctor, the hospital's social service department or through private nursing agencies to see if your insurance will cover this kind of care.  Follow Up  Follow up with your doctor or as advised by our staff if your symptoms do not begin to improve after one week. A repeat x-ray or a CT-scan may be needed to check again for a fracture.  [NOTE: If X-rays were taken, they will be reviewed by a radiologist. You will be notified of any new findings that may affect your care.]  Get Prompt Medical Attention  Get prompt medical attention if any of the following occur:   Increased swelling or increased bruising   Pain becomes worse   Decreased ability to bear weight on the injured side   Swelling or pain below your knee   Chest pain or shortness of breath   2000-2015 [employer]  [address], [place] [address]. All rights reserved. This information is not intended as a substitute for professional medical care. Always follow your healthcare professional's instructions.

## 2014-11-18 ENCOUNTER — Emergency Department: Payer: Self-pay

## 2014-11-18 ENCOUNTER — Emergency Department
Admission: EM | Admit: 2014-11-18 | Discharge: 2014-11-18 | Disposition: A | Discharge status: Home / Self Care | Payer: Self-pay | Attending: Emergency Medicine | Admitting: Emergency Medicine

## 2014-11-18 DIAGNOSIS — X58XXXA Exposure to other specified factors, initial encounter: Secondary | ICD-10-CM | POA: Insufficient documentation

## 2014-11-18 DIAGNOSIS — S60211A Contusion of right wrist, initial encounter: Secondary | ICD-10-CM | POA: Insufficient documentation

## 2014-11-18 MED ORDER — TRAMADOL HCL 50 MG PO TABS
50.0000 mg | ORAL_TABLET | Freq: Three times a day (TID) | ORAL | Status: DC | PRN
Start: 2014-11-18 — End: 2016-02-05

## 2014-11-18 NOTE — ED Notes (Signed)
Tripped over his dog this morning. Landed on right wrist. C/o pain and swelling to right wrist. C/o burning from wrist up to elbow. Pain with movement.

## 2014-11-18 NOTE — ED Provider Notes (Signed)
Physician/Midlevel provider first contact with patient: 11/18/14 1205         History     Chief Complaint   Patient presents with   . Wrist Pain       Patient is a 50 year old male who tripped and fell over his dog this morning sustaining a right wrist injury.  He noted the area in his distal right forearm and wrist was swollen and he has increased pain with flexion/extension.  He has a previous history of hand fracture.  No other history sustained.      Past Medical History   Diagnosis Date   . HTN (hypertension)    . Hyperlipidemia    . Arthritis    . Abscess        Past Surgical History   Procedure Laterality Date   . Appendectomy     . Abdominal surgery     . Appendectomy     . Spleen surgery       not splenectomy   . Laparoscopic, cholecystectomy, cholangiogram N/A 06/27/2014     Procedure: LAPAROSCOPIC, CHOLECYSTECTOMY, CHOLANGIOGRAM;  Surgeon: Tyson Alias, MD;  Location: [facility];  Service: General;  Laterality: N/A;       Family History   Problem Relation Age of Onset   . Diabetes Father    . Heart attack Father    . Heart disease Father    . Hyperlipidemia Father    . Hypertension Father        Social  History   Substance Use Topics   . Smoking status: Current Every Day Smoker -- 0.50 packs/day for 32 years     Types: Cigarettes   . Smokeless tobacco: Never Used   . Alcohol Use: 0.0 oz/week     0 Standard drinks or equivalent per week      Comment: speical occasions       .     Allergies   Allergen Reactions   . Morphine    . Vicodin [Hydrocodone-Acetaminophen]        Home Medications     Last Medication Reconciliation Action:  Complete Epifania Gore, RN 11/18/2014 12:15 PM                  aspirin 325 MG EC tablet     Take 1 tablet (325 mg total) by mouth daily.     lisinopril (PRINIVIL,ZESTRIL) 10 MG tablet     Take 1 tablet (10 mg total) by mouth daily.     Patient taking differently:  Take 10 mg by mouth daily. Out of medication for approximately 2-3 weeks       metoprolol XL  (TOPROL-XL) 25 MG 24 hr tablet     Take 25 mg by mouth daily. Out of medications, approximately 2-3 weeks                       Review of Systems   Constitutional: Negative.    Musculoskeletal: Positive for arthralgias.        Right wrist pain   Skin: Negative.    Hematological: Negative.        Physical Exam    BP: 144/89 mmHg, Heart Rate: (!) 107, Temp: 97.7 F (36.5 C), Resp Rate: 18, SpO2: 96 %, Weight: 92.2 kg     Physical Exam   Constitutional: He is oriented to person, place, and time. He appears well-developed and well-nourished. No distress.   HENT:   Head: Normocephalic  and atraumatic.   Musculoskeletal: Normal range of motion. He exhibits edema and tenderness.   Right wrist tenderness along media/scaphid region   Neurological: He is alert and oriented to person, place, and time.   Skin: Skin is warm. He is not diaphoretic. No erythema.   Nursing note and vitals reviewed.        MDM and ED Course     ED Medication Orders     None         Xr Wrist Right 3+ Views    11/18/2014   1.  No acute abnormality. 2.  Chronic fracture of the midshaft of the fourth metacarpal which healed with mild deformity. 3.  Typical osteoarthritis which is mild in the first carpometacarpal and scaphotrapezium joints and is moderate in the radiocarpal joint.  ReadingStation:[id number]       MDM  Number of Diagnoses or Management Options  Contusion of right wrist, initial encounter: new and requires workup     Amount and/or Complexity of Data Reviewed  Tests in the radiology section of CPT: reviewed and ordered    Risk of Complications, Morbidity, and/or Mortality  Presenting problems: moderate  Management options: moderate    Patient Progress  Patient progress: stable           Placed in a thumb spica splint and will have close follow up with ortho if symptoms no better this week.  May need dedicated scaphoid views in follow up.      Procedures    Clinical Impression & Disposition     Clinical Impression  Final diagnoses:   Contusion  of right wrist, initial encounter        ED Disposition     Discharge Gary Vazquez. discharge to home/self care.    Condition at disposition: Stable             New Prescriptions    No medications on file                   Blanca Friend, MD  11/23/14 [phone]

## 2014-11-18 NOTE — Discharge Instructions (Signed)
Understanding Bone Bruise (Bone Contusion)  A bone bruise is an injury to a bone that is less severe than a bone fracture. Bone bruises are fairly common. They can happen to people of all ages. Any type of bone in your body can get a bone bruise. Other injuries often happen along with a bone bruise, such as damage to nearby ligaments.  What happens when a bone is bruised?  Bone is made of different kinds of tissue. The periosteum is a thin layer of tissue that covers most of a bone. Where bones come together, there is usually a layer of cartilage at the edges. The bone here is called subchondral bone. Deep inside the bone is an area called the medulla. It contains the bone marrow and fibrous tissue called trabeculae.  With a bone fracture, all of the trabeculae in a region of bone have broken. But with a bone bruise, an injury only damages some of these trabeculae. An injury might cause blood to build up in the area beneath the periosteum. This causes a subperiosteal hematoma, a type of bone bruise. An injury might also cause bleeding and swelling in the area between your cartilage and the bone beneath it. This causes a subchondral bone bruise. Or bleeding and swelling can occur in the medulla of your bone. This is called an interosseous bone bruise.  What causes a bone bruise?  Injury of any kind can cause a bone bruise. Sports injuries, motor vehicle accidents, or falls from a height can cause them. Twisting injuries that cause joint sprains can also cause a bone bruise. Health conditions like arthritis may also lead to a bone bruise. This is because arthritis causes bone surfaces to grind against each other. Child abuse is another cause of bone bruises.  Symptoms of a bone bruise  Symptoms of a bone bruise can include:   Pain and soreness in the injured area   Swelling in the area and soft tissues around it   Change in color of the injured area   Swelling or stiffness of an injured joint  This pain is often  more severe and lasts longer than a soft tissue injury. How severe your symptoms are and how long they last depends on how severe the bone bruise is.  Diagnosing a bone bruise  Your health care provider will ask you about your medical history and symptoms. He or she will ask how you got your injury. Your provider will examine the injured area to check for pain, bruising, and swelling. After the exam, your health care provider may be able to tell if you have a bone bruise.  A bone bruise doesn't show up on an X-ray. But you may be given an X-ray to rule out a bone fracture. A fracture may need a different kind of treatment. An MRI can confirm a bone bruise. But your health care provider will likely only give you an MRI if your symptoms don't get better.   2000-2015 [employer] [address], [place] [address]. All rights reserved. This information is not intended as a substitute for professional medical care. Always follow your healthcare professional's instructions.

## 2015-03-28 IMAGING — CT CT ABD-PELV W/ CM
2 of 5 series · 16 of 46 positions shown, 18 images · IV contrast (omnipaque)
Comparison: None.

CLINICAL DATA: 48-year-old male status post cholecystectomy 3 weeks
ago with pain across the incision site. On antibiotics. Initial
encounter.

EXAM:
CT ABDOMEN AND PELVIS WITH CONTRAST
TECHNIQUE: Multidetector CT imaging of the abdomen and pelvis was performed
using the standard protocol following bolus administration of
intravenous contrast.
CONTRAST:  100mL OMNIPAQUE IOHEXOL 300 MG/ML  SOLN

[Series 2: abd/ pelvis 5.0 i30f 1 · axial · 0.81mm/px · z∈[-466,-66]mm · 13 of 90 slices shown, 15 images]
[im 5/90  soft-tissue]
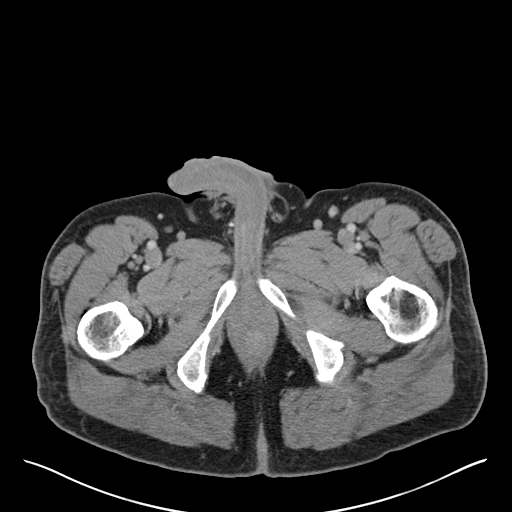
[im 5/90  bone]
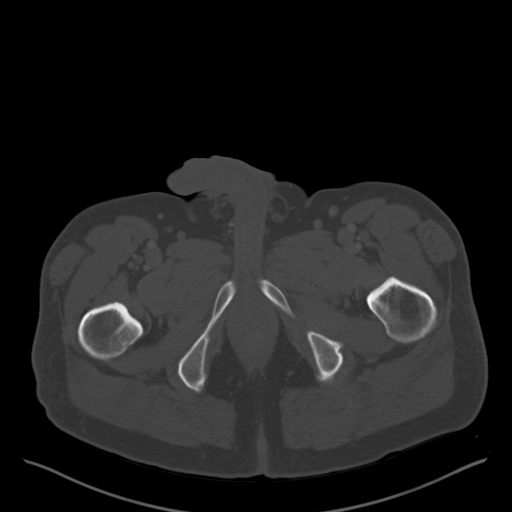
[im 15/90  soft-tissue]
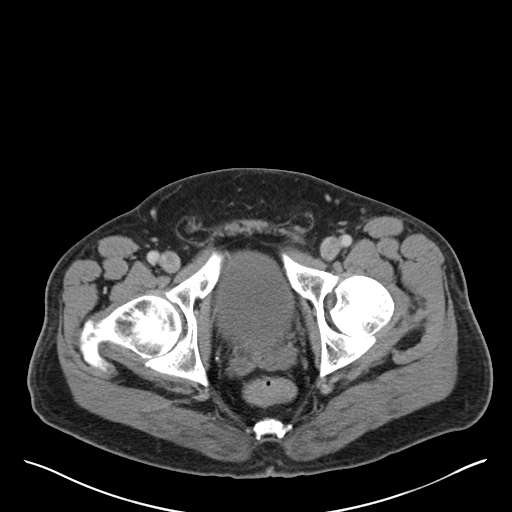
[im 19/90  soft-tissue]
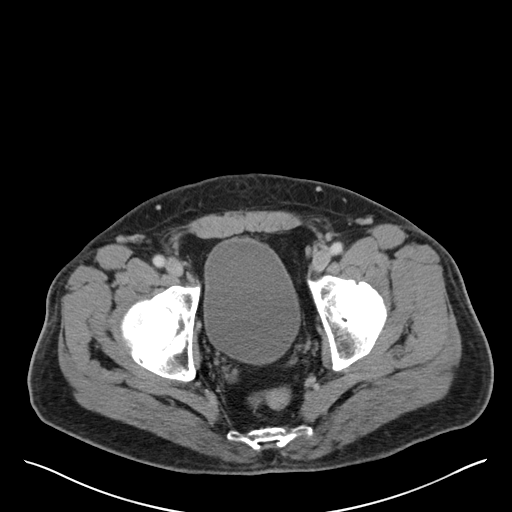
[im 24/90  soft-tissue]
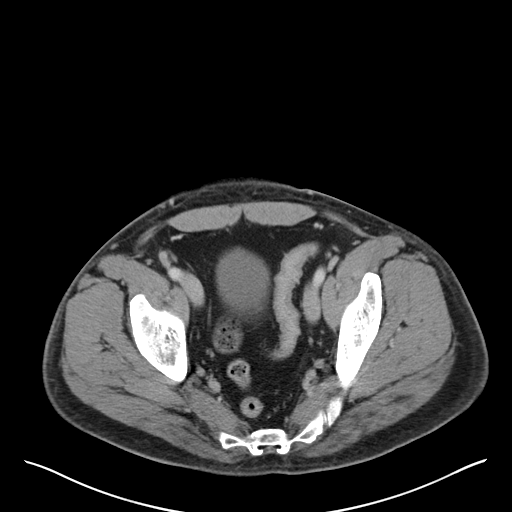
[im 33/90  soft-tissue]
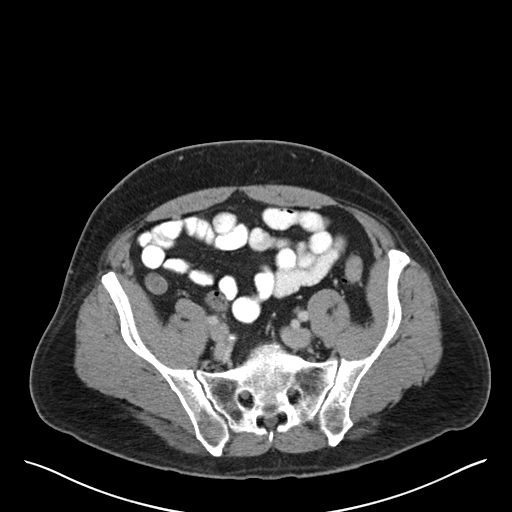
[im 38/90  soft-tissue]
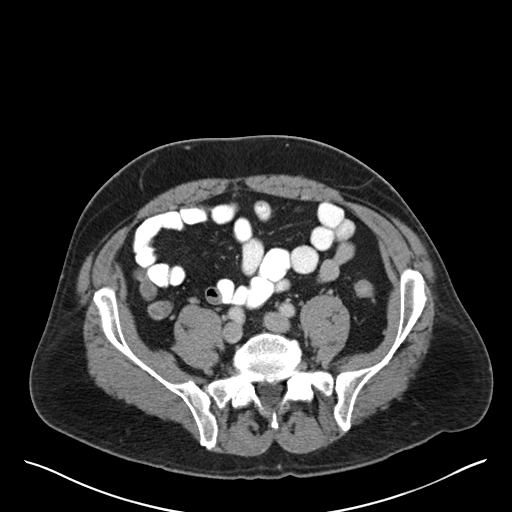
[im 47/90  soft-tissue]
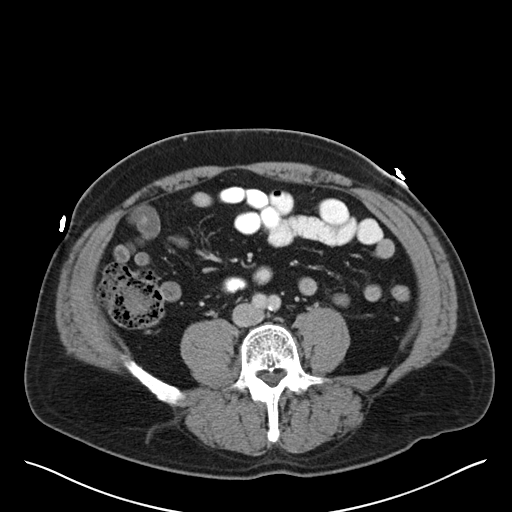
[im 52/90  soft-tissue]
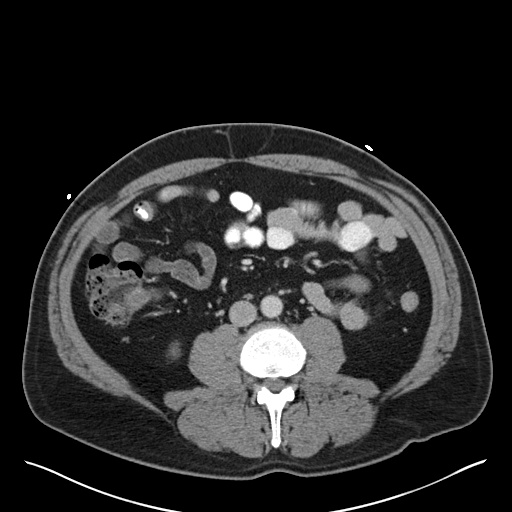
[im 57/90  soft-tissue]
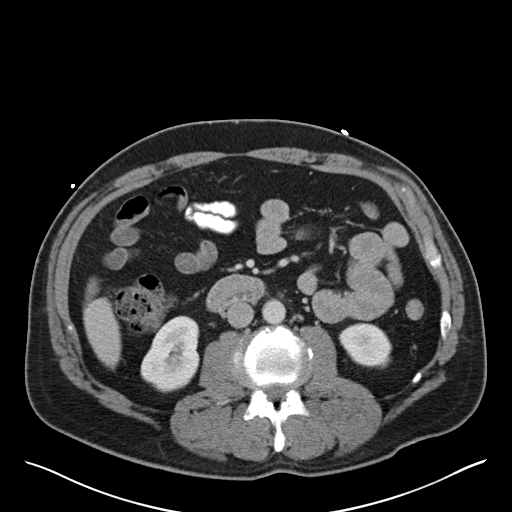
[im 57/90  bone]
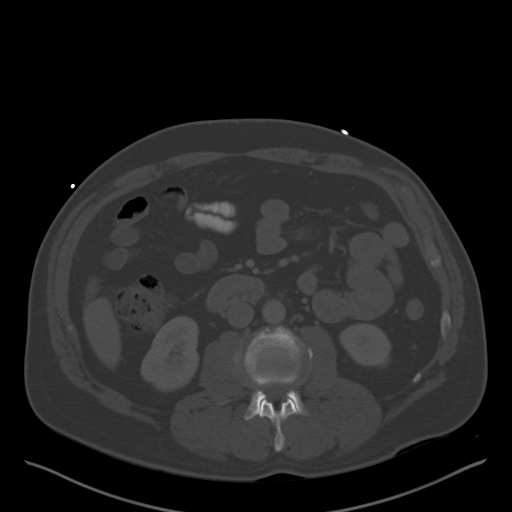
[im 66/90  soft-tissue]
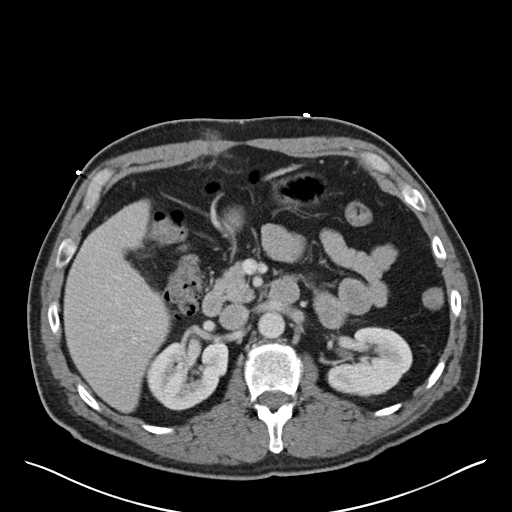
[im 71/90  soft-tissue]
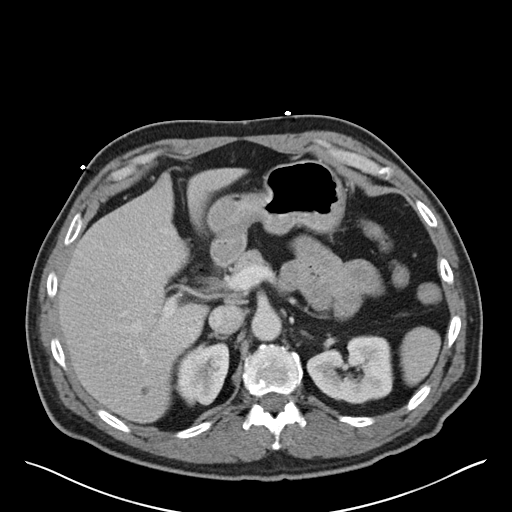
[im 75/90  soft-tissue]
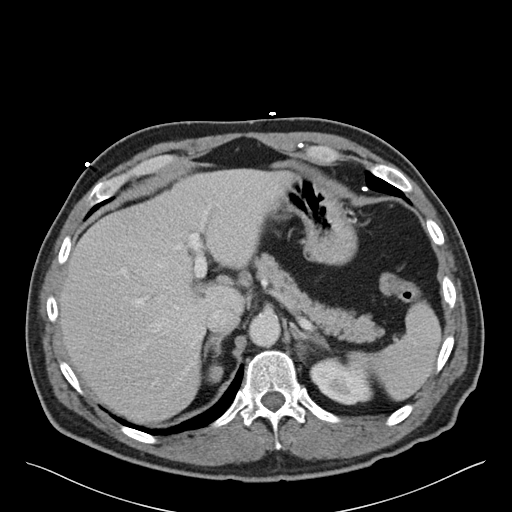
[im 85/90  soft-tissue]
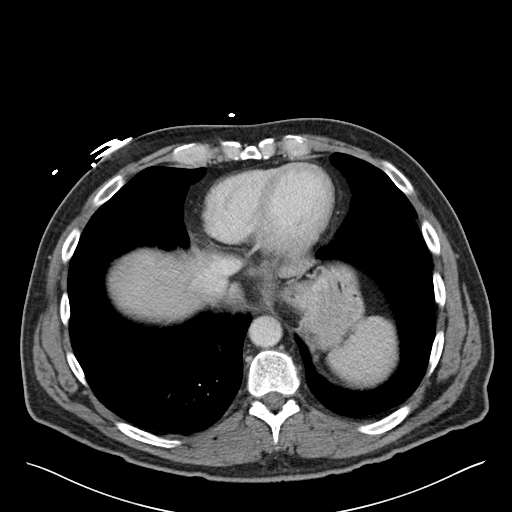

[Series 5: coronal soft tissue · coronal · 0.87mm/px · 3 of 94 slices shown]
[im 32/94  soft-tissue]
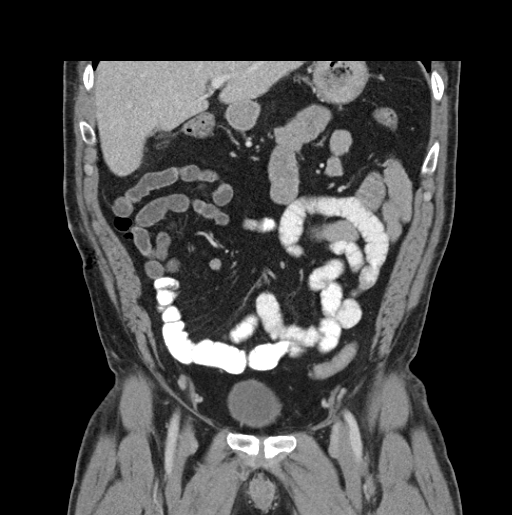
[im 42/94  soft-tissue]
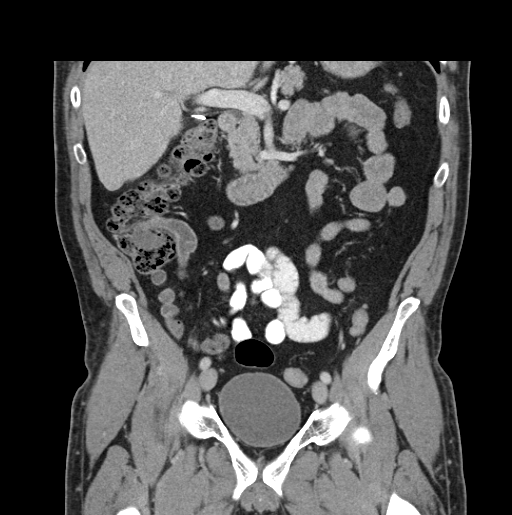
[im 52/94  soft-tissue]
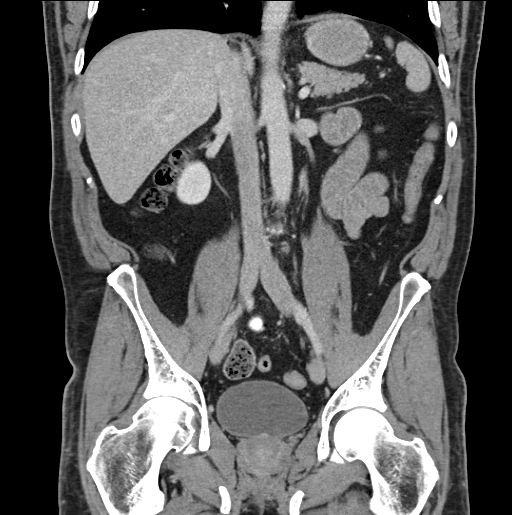

[16 of 46 positions shown; findings below may reference images not displayed]

FINDINGS: Minor lung base atelectasis.  No pericardial or pleural effusion.

No acute osseous abnormality identified.

No pelvic free fluid. Unremarkable bladder. Decompressed distal
colon. Decompressed left colon and splenic flexure. Decompressed
transverse colon. Mild diverticulosis at the hepatic flexure with no
active inflammation. Negative right colon. The appendix appears to
be surgically absent. Negative terminal ileum. No dilated or
inflamed small bowel loops.

Stomach and duodenum are within normal limits. Surgical clips at the
gallbladder fossa with no surrounding fluid or inflammation. No
intra or extrahepatic biliary ductal enlargement. Liver enhancement
within normal limits (5 mm hypodensity in the posterior right lobe
is too small to characterize but likely a benign cyst). Spleen,
pancreas, and kidneys are within normal limits.

No abdominal free fluid. Portal venous system is patent. Major
arterial structures in the abdomen and pelvis are patent with mild
aortoiliac calcified atherosclerosis. No lymphadenopathy.

Right upper quadrant ventral abdominal wall demonstrates mild
stranding compatible with healing along a surgical approach (series
2, image 25). No adverse features.

The adrenal glands are enlarged left greater than right, but
adreniform shape is preserved. The enlarged left adrenal measures up
to 23 x 43 mm on axial images. The right adrenal measures 12 x 15
mm. Both demonstrate low to intermediate densitometry.
IMPRESSION: 1. No adverse features status post cholecystectomy.
2. No acute or inflammatory findings in the abdomen or pelvis.
3. Low-density enlargement of the left greater than right adrenal
glands. Maintenance of adreniform shape. This suggests hyperplasia
and possible underlying adenomas.

## 2016-02-05 ENCOUNTER — Emergency Department
Admission: EM | Admit: 2016-02-05 | Discharge: 2016-02-05 | Disposition: A | Discharge status: Home / Self Care | Payer: Self-pay | Attending: Emergency Medicine | Admitting: Emergency Medicine

## 2016-02-05 ENCOUNTER — Emergency Department: Payer: Self-pay

## 2016-02-05 DIAGNOSIS — S7001XA Contusion of right hip, initial encounter: Secondary | ICD-10-CM | POA: Insufficient documentation

## 2016-02-05 DIAGNOSIS — W19XXXA Unspecified fall, initial encounter: Secondary | ICD-10-CM

## 2016-02-05 DIAGNOSIS — W010XXA Fall on same level from slipping, tripping and stumbling without subsequent striking against object, initial encounter: Secondary | ICD-10-CM | POA: Insufficient documentation

## 2016-02-05 MED ORDER — OXYCODONE-ACETAMINOPHEN 5-325 MG PO TABS
1.0000 | ORAL_TABLET | Freq: Three times a day (TID) | ORAL | Status: DC | PRN
Start: 2016-02-05 — End: 2017-11-13

## 2016-02-05 NOTE — Discharge Instructions (Signed)
Hip Contusion    A contusion is another word for a bruise. It happens when small blood vessels break open and leak blood into the nearby area. A hip contusion can result from a bump, hit, or fall.Symptoms of a contusion often includechanges in skin color(bruising), swelling, and pain. It may take several hours for a deep bruise to show up.If the injury is severe, you may need an x-ray to check for broken bones. Swelling shoulddecreasein a few days. Bruising and pain may take several weeks to go away.  Home care   Unless another medicine was prescribed, you may take acetaminophen, ibuprofen, or naproxento help relieve pain and swelling.If needed, stronger pain medicines may be prescribed. Take all medicines exactly as directed.   Ice the bruised area to help reduce pain and swelling.Wrap a cold source (ice packor ice cubes in a plastic bag) in a thin towel. Apply the cold source to the bruised area for 20 minutes every 1 to 2 hours the first day. Continue this 3 to 4 times a day until the pain and swelling goes away.   If walking causes pain, use crutches or a walker until you can walk without pain. These items can be rented at most pharmacies and orthopedic supply stores.   If your injury is keeping you from moving around or caring for yourself properly, you may qualify for services such as home health care. Check with your insurance company to see if this type of care is covered.  Follow-up  Follow up with your healthcare provider, or as advised.  When to seek medical advice  Call your healthcare provider right away if any of these occur:   Increased pain, bruising, or swelling near the injured area   Decreased ability to bear weight on the injured side   Pain or swelling develops below the knee   Chest pain or shortness of breath  Date Last Reviewed: 12/02/2013   2000-2016 [employer] [address], [place] [address]. All rights reserved. This information is not intended  as a substitute for professional medical care. Always follow your healthcare professional's instructions.

## 2016-02-05 NOTE — ED Provider Notes (Signed)
[facility]  EMERGENCY DEPARTMENT  History and Physical Exam       Patient Name: Gary Vazquez,[name]  Encounter Date:  02/05/2016  Physician Assistant: Gweneth Dimitri, PA-C  Attending Physician: Ermalene Postin, MD  PCP: Christa See, MD  Patient DOB:  09/18/1965  MRN:  [id number]  Room:  E50/E50-A      History of Presenting Illness     Chief complaint: Hip Pain    HPI/ROS given by: Patient    Gary Vazquez. is a 50 y.o. male who presents with right hip pain after falling yesterday. He tripped over his dog. He landed/struck his right hip on the footstool and then on the floor. Pain is worse with movement, walking, and weightbearing. He thinks he broke his hip again. He broke his hip a few years ago that did not require surgery. He has been taking Tylenol and Motrin with minimal relief. He did not hit his head or lose consciousness. He has no other injuries.     He was able to walk on his own, but he had pain.  Review of Systems     Review of Systems   Constitutional: Negative for fever and chills.   HENT: Negative.    Eyes: Negative.    Respiratory: Negative.    Cardiovascular: Negative.    Gastrointestinal: Negative for abdominal pain.   Genitourinary: Negative.    Musculoskeletal: Positive for arthralgias and gait problem. Negative for back pain, joint swelling and neck pain.   Skin: Positive for color change. Negative for wound.        Mild bruising right hip area.   Neurological: Negative for numbness.      Rest of review of systems is negative.  Allergies & Medications     Pt is allergic to morphine and vicodin.    Discharge Medication List as of 02/05/2016 12:43 PM      CONTINUE these medications which have NOT CHANGED    Details   aspirin 325 MG EC tablet Take 1 tablet (325 mg total) by mouth daily., Starting 03/02/2014, Until Discontinued, Print      lisinopril (PRINIVIL,ZESTRIL) 10 MG tablet Take 1 tablet (10 mg total) by mouth daily., Starting 03/02/2014, Until Discontinued, Print       metoprolol XL (TOPROL-XL) 25 MG 24 hr tablet Take 25 mg by mouth daily. Out of medications, approximately 2-3 weeks  , Until Discontinued, Historical Med              Past Medical History     Pt has a past medical history of HTN (hypertension); Hyperlipidemia; Arthritis; and Abscess.     Past Surgical History     Pt  has past surgical history that includes Appendectomy; Abdominal surgery; Appendectomy; spleen surgery; and LAPAROSCOPIC, CHOLECYSTECTOMY, CHOLANGIOGRAM (N/A, 06/27/2014).     Family History     The family history includes Diabetes in his father; Heart attack in his father; Heart disease in his father; Hyperlipidemia in his father; Hypertension in his father.     Social History     Pt reports that he has been smoking Cigarettes.  He has a 16 pack-year smoking history. He has never used smokeless tobacco. He reports that he drinks alcohol. He reports that he does not use illicit drugs.     Physical Exam     Blood pressure 146/101, pulse 101, temperature 97.8 F (36.6 C), temperature source Oral, resp. rate 17, height 1.829 m, weight 94.3 kg, SpO2 98 %.  Constitutional: Not in distress. Well-hydrated.  HEENT: Eyes: PERRL, no scleral icterus or conjunctival pallor. Nose: Normal appearance.  Neck: Trachea midline, supple, no thyroidmegaly or masses.  Cardiovascular: RRR, normal S1 S2, no murmurs, gallops, or rubs.   Respiratory: Normal rate. Clear to auscultation bilaterally.  Gastrointestinal: +BS, non-distended, soft, non-tender, no rebound or guarding, no hepatosplenomegaly.  Musculoskeletal: Right hip pain worse with movement. Mild ecchymosis. No significant swelling or deformity. Pelvis normal. Lumbar spine normal. Dorsal pedalis pulses 2+ and symmetric.  Extremities: No edema or calf tenderness.  Skin: No rashes, lesions, or jaundice.  Neurologic: CN 2-12 grossly intact. No gross motor or sensory deficits.  Psychiatric: AAOx3, affect and mood appropriate. Alert, interactive, and appropriate.          Diagnostic Results     The results of the diagnostic studies below have been reviewed by myself:    Labs  Results     ** No results found for the last 24 hours. **          Radiologic Studies  Xr Hip Right Ap And Lateral    02/05/2016  Now mild to moderate right hip osteoarthritis, progressed from prior. No acute finding. ReadingStation:[email or web address]           ED Course and Medical Decision Making     ED Medication Orders     None        Patient was seen for right hip injury after falling. This is most likely a hip contusion.    X-ray of the right hip shows no acute fractures or dislocation.    He was given Percocet to take as needed for pain.    He was scheduled to follow-up with the transition clinic. If he has persistent pain, then he will need a repeat x-ray and/or a CT scan.    He was discharged home in stable condition. He was able to walk on his own.    He was advised to return to the emergency department for worsening or new symptoms.    In addition to the above history, please see nursing notes. Allergies, meds, past medical, family, social hx, and the results of the diagnostic studies performed have been reviewed by myself.    I discussed this case with the attending physician in the emergency department and they agree with the assessment and treatment plan.     This chart was generated by an EMR and may contain errors or omissions not intended by the user.     Procedures / Critical Care     None     Diagnosis / Disposition     Clinical Impression  1. Contusion of right hip, initial encounter    2. Fall, initial encounter        Disposition  ED Disposition     Discharge Gary Vazquez. discharge to home/self care.    Condition at disposition: Stable              Follow up for Discharged Patients  [facility] Emergency Department  [address]  [place] [address]  [phone]    As needed, If symptoms worsen    [facility]  [address]  [place]  [address]  [phone]  Schedule an appointment as soon as possible for a visit in 1 week        Prescriptions for Discharged Patients  Discharge Medication List as of 02/05/2016 12:43 PM      START taking these  medications    Details   oxyCODONE-acetaminophen (PERCOCET) 5-325 MG per tablet Take 1 tablet by mouth every 8 (eight) hours as needed for Pain., Starting 02/05/2016, Until Discontinued, Print                      Gweneth Dimitri [place], [place]  02/06/16 [id number]    Ermalene Postin, MD  02/06/16 [phone]

## 2021-04-21 ENCOUNTER — Inpatient Hospital Stay: Payer: Medicaid Other

## 2021-04-21 ENCOUNTER — Emergency Department: Payer: Medicaid Other

## 2021-04-21 ENCOUNTER — Inpatient Hospital Stay
Admission: EM | Admit: 2021-04-21 | Discharge: 2021-04-24 | DRG: 134 | Disposition: A | Discharge status: Home / Self Care | Payer: Medicaid Other | Attending: Family Medicine | Admitting: Family Medicine

## 2021-04-21 DIAGNOSIS — Z9049 Acquired absence of other specified parts of digestive tract: Secondary | ICD-10-CM

## 2021-04-21 DIAGNOSIS — F17200 Nicotine dependence, unspecified, uncomplicated: Secondary | ICD-10-CM | POA: Diagnosis present

## 2021-04-21 DIAGNOSIS — R609 Edema, unspecified: Secondary | ICD-10-CM

## 2021-04-21 DIAGNOSIS — I10 Essential (primary) hypertension: Secondary | ICD-10-CM | POA: Diagnosis present

## 2021-04-21 DIAGNOSIS — K760 Fatty (change of) liver, not elsewhere classified: Secondary | ICD-10-CM | POA: Diagnosis present

## 2021-04-21 DIAGNOSIS — D6869 Other thrombophilia: Secondary | ICD-10-CM | POA: Diagnosis present

## 2021-04-21 DIAGNOSIS — Z8249 Family history of ischemic heart disease and other diseases of the circulatory system: Secondary | ICD-10-CM

## 2021-04-21 DIAGNOSIS — F17210 Nicotine dependence, cigarettes, uncomplicated: Secondary | ICD-10-CM | POA: Diagnosis present

## 2021-04-21 DIAGNOSIS — E669 Obesity, unspecified: Secondary | ICD-10-CM | POA: Diagnosis present

## 2021-04-21 DIAGNOSIS — R7303 Prediabetes: Secondary | ICD-10-CM | POA: Diagnosis present

## 2021-04-21 DIAGNOSIS — Z79899 Other long term (current) drug therapy: Secondary | ICD-10-CM

## 2021-04-21 DIAGNOSIS — Z20822 Contact with and (suspected) exposure to covid-19: Secondary | ICD-10-CM | POA: Diagnosis present

## 2021-04-21 DIAGNOSIS — I2694 Multiple subsegmental pulmonary emboli without acute cor pulmonale: Secondary | ICD-10-CM

## 2021-04-21 DIAGNOSIS — R0602 Shortness of breath: Secondary | ICD-10-CM

## 2021-04-21 DIAGNOSIS — R590 Localized enlarged lymph nodes: Secondary | ICD-10-CM | POA: Diagnosis present

## 2021-04-21 DIAGNOSIS — Z6831 Body mass index (BMI) 31.0-31.9, adult: Secondary | ICD-10-CM

## 2021-04-21 DIAGNOSIS — Z83438 Family history of other disorder of lipoprotein metabolism and other lipidemia: Secondary | ICD-10-CM

## 2021-04-21 DIAGNOSIS — Z833 Family history of diabetes mellitus: Secondary | ICD-10-CM

## 2021-04-21 DIAGNOSIS — Z28310 Unvaccinated for covid-19: Secondary | ICD-10-CM

## 2021-04-21 DIAGNOSIS — Z885 Allergy status to narcotic agent status: Secondary | ICD-10-CM

## 2021-04-21 DIAGNOSIS — I2692 Saddle embolus of pulmonary artery without acute cor pulmonale: Principal | ICD-10-CM | POA: Diagnosis present

## 2021-04-21 DIAGNOSIS — M1990 Unspecified osteoarthritis, unspecified site: Secondary | ICD-10-CM | POA: Diagnosis present

## 2021-04-21 DIAGNOSIS — E785 Hyperlipidemia, unspecified: Secondary | ICD-10-CM | POA: Diagnosis present

## 2021-04-21 DIAGNOSIS — F1110 Opioid abuse, uncomplicated: Secondary | ICD-10-CM | POA: Diagnosis present

## 2021-04-21 DIAGNOSIS — F1210 Cannabis abuse, uncomplicated: Secondary | ICD-10-CM | POA: Diagnosis present

## 2021-04-21 DIAGNOSIS — R000 Tachycardia, unspecified: Secondary | ICD-10-CM | POA: Diagnosis present

## 2021-04-21 DIAGNOSIS — I82451 Acute embolism and thrombosis of right peroneal vein: Secondary | ICD-10-CM | POA: Diagnosis present

## 2021-04-21 LAB — BASIC METABOLIC PANEL
Anion Gap: 13.9 mMol/L (ref 7.0–18.0)
BUN / Creatinine Ratio: 16.3 Ratio (ref 10.0–30.0)
BUN: 15 mg/dL (ref 7–22)
CO2: 27.4 mMol/L (ref 20.0–30.0)
Calcium: 9.3 mg/dL (ref 8.5–10.5)
Chloride: 102 mMol/L (ref 98–110)
Creatinine: 0.92 mg/dL (ref 0.80–1.30)
EGFR: 97 mL/min/{1.73_m2} (ref 60–150)
Glucose: 192 mg/dL — ABNORMAL HIGH (ref 71–99)
Osmolality Calculated: 284 mOsm/kg (ref 275–300)
Potassium: 4.3 mMol/L (ref 3.5–5.3)
Sodium: 139 mMol/L (ref 136–147)

## 2021-04-21 LAB — CBC AND DIFFERENTIAL
Basophils %: 0.8 % (ref 0.0–3.0)
Basophils Absolute: 0.1 10*3/uL (ref 0.0–0.3)
Eosinophils %: 2.2 % (ref 0.0–7.0)
Eosinophils Absolute: 0.2 10*3/uL (ref 0.0–0.8)
Hematocrit: 48 % (ref 39.0–52.5)
Hemoglobin: 16 gm/dL (ref 13.0–17.5)
Lymphocytes Absolute: 1.7 10*3/uL (ref 0.6–5.1)
Lymphocytes: 16 % (ref 15.0–46.0)
MCH: 32 pg (ref 28–35)
MCHC: 33 gm/dL (ref 32–36)
MCV: 97 fL (ref 80–100)
MPV: 8 fL (ref 6.0–10.0)
Monocytes Absolute: 0.9 10*3/uL (ref 0.1–1.7)
Monocytes: 8.5 % (ref 3.0–15.0)
Neutrophils %: 72.5 % (ref 42.0–78.0)
Neutrophils Absolute: 7.8 10*3/uL (ref 1.7–8.6)
PLT CT: 193 10*3/uL (ref 130–440)
RBC: 4.94 10*6/uL (ref 4.00–5.70)
RDW: 12 % (ref 11.0–14.0)
WBC: 10.8 10*3/uL (ref 4.0–11.0)

## 2021-04-21 LAB — I-STAT CHEM 8 CARTRIDGE
Anion Gap I-Stat: 17 — ABNORMAL HIGH (ref 7.0–16.0)
BUN I-Stat: 16 mg/dL (ref 7–22)
Calcium Ionized I-Stat: 4.7 mg/dL (ref 4.35–5.10)
Chloride I-Stat: 99 mMol/L (ref 98–110)
Creatinine I-Stat: 0.9 mg/dL (ref 0.80–1.30)
EGFR: 100 mL/min/{1.73_m2} (ref 60–150)
Glucose I-Stat: 195 mg/dL — ABNORMAL HIGH (ref 71–99)
Hematocrit I-Stat: 50 % (ref 39.0–52.5)
Hemoglobin I-Stat: 17 gm/dL (ref 13.0–17.5)
Potassium I-Stat: 4.2 mMol/L (ref 3.5–5.3)
Sodium I-Stat: 138 mMol/L (ref 136–147)
TCO2 I-Stat: 27 mMol/L (ref 24–29)

## 2021-04-21 LAB — VH URINE DRUG SCREEN - NO CONFIRMATION
Propoxyphene: NEGATIVE
Urine Amphetamine: NEGATIVE
Urine Barbiturates: NEGATIVE
Urine Benzodiazepines: NEGATIVE
Urine Buprenorphine: POSITIVE — AB
Urine Cannabinoids: POSITIVE — AB
Urine Cocaine: NEGATIVE
Urine Creatinine Random: 149 mg/dL
Urine Ecstasy Screen: NEGATIVE
Urine Fentanyl Screen: NEGATIVE
Urine Methadone Screen: NEGATIVE
Urine Opiates: NEGATIVE
Urine Oxycodone: NEGATIVE
Urine Phencyclidine: NEGATIVE
Urine Specific Gravity: 1.027 (ref 1.001–1.040)
pH, Urine: 5 pH (ref 5.0–8.0)

## 2021-04-21 LAB — VH DEXTROSE STICK GLUCOSE
Glucose POCT: 147 mg/dL — ABNORMAL HIGH (ref 71–99)
Glucose POCT: 113 mg/dL — ABNORMAL HIGH (ref 71–99)

## 2021-04-21 LAB — APTT: aPTT: 27 s (ref 24.0–34.0)

## 2021-04-21 LAB — VH XPERT XPRESS © SARS-COV-2 PLUS
Does patient have symptoms related to condition of interest?: NEGATIVE
Does patient reside in a congregate care setting?: NEGATIVE
Is patient employed in a healthcare setting?: NEGATIVE
Is the patient hospitalized because of this condition?: NEGATIVE
Is the patient pregnant?: NEGATIVE
SARS-CoV-2 RNA: NEGATIVE

## 2021-04-21 LAB — HEMOGLOBIN A1C
Estimated Average Glucose: 137 mg/dL
Hgb A1C, %: 6.4 %

## 2021-04-21 LAB — TROPONIN I
Troponin I: 0.05 ng/mL — ABNORMAL HIGH (ref 0.00–0.02)
Troponin I: 0.04 ng/mL — ABNORMAL HIGH (ref 0.00–0.02)
Troponin I: 0.05 ng/mL — ABNORMAL HIGH (ref 0.00–0.02)

## 2021-04-21 LAB — VH I-STAT CHEM 8 NOTIFICATION

## 2021-04-21 LAB — PT/INR
PT INR: 1 (ref 0.9–1.1)
PT: 10.9 s (ref 9.4–11.5)

## 2021-04-21 LAB — VH APTT( HEPARIN INFUSION THERAPY): aPTT: 74.9 s — ABNORMAL HIGH (ref 24.0–34.0)

## 2021-04-21 LAB — D-DIMER, QUANTITATIVE: D-Dimer: 6.23 mg/L FEU — ABNORMAL HIGH (ref 0.19–0.52)

## 2021-04-21 LAB — B-TYPE NATRIURETIC PEPTIDE: B-Natriuretic Peptide: 84.3 pg/mL (ref 0.0–100.0)

## 2021-04-21 MED ORDER — NICOTINE 21 MG/24HR TD PT24
MEDICATED_PATCH | TRANSDERMAL | Status: AC
Start: 2021-04-21 — End: ?
  Filled 2021-04-21: qty 1

## 2021-04-21 MED ORDER — HEPARIN SODIUM (PORCINE) 1000 UNIT/ML IJ SOLN
INTRAMUSCULAR | Status: AC
Start: 2021-04-21 — End: ?
  Filled 2021-04-21: qty 10

## 2021-04-21 MED ORDER — HEPARIN SOD (PORCINE) IN D5W 25000-5 UT/500ML-% IV SOLN
INTRAVENOUS | Status: AC
Start: 2021-04-21 — End: ?
  Filled 2021-04-21: qty 500

## 2021-04-21 MED ORDER — FENTANYL CITRATE (PF) 50 MCG/ML IJ SOLN (WRAP)
INTRAMUSCULAR | Status: AC | PRN
Start: 2021-04-21 — End: 2021-04-21
  Administered 2021-04-21: 50 ug via INTRAVENOUS

## 2021-04-21 MED ORDER — VH HEPARIN 10,000 UNITS/1000 ML NS
INJECTION | INTRAVENOUS | Status: AC
Start: 2021-04-21 — End: ?
  Filled 2021-04-21: qty 1000

## 2021-04-21 MED ORDER — ACETAMINOPHEN 650 MG RE SUPP
650.0000 mg | RECTAL | Status: DC | PRN
Start: 2021-04-21 — End: 2021-04-24

## 2021-04-21 MED ORDER — VH MIDAZOLAM HCI 2 MG/2ML IJ SOLN (IR NARRATOR)
INTRAMUSCULAR | Status: AC | PRN
Start: 2021-04-21 — End: 2021-04-21
  Administered 2021-04-21: 1 mg via INTRAVENOUS

## 2021-04-21 MED ORDER — VH ELECTROLYTE REPLACEMENT PROTOCOL PLACEHOLDER
Status: DC | PRN
Start: 2021-04-21 — End: 2021-04-22
  Filled 2021-04-21: qty 1

## 2021-04-21 MED ORDER — SODIUM CHLORIDE (PF) 0.9 % IJ SOLN
3.0000 mL | Freq: Two times a day (BID) | INTRAMUSCULAR | Status: DC
Start: 2021-04-21 — End: 2021-04-24
  Administered 2021-04-21 – 2021-04-24 (×6): 3 mL via INTRAVENOUS

## 2021-04-21 MED ORDER — VH HEPARIN SODIUM (PORCINE) 10000 UNIT/ML IJ SOLN (REBOLUS)
4000.0000 [IU] | Freq: Four times a day (QID) | INTRAMUSCULAR | Status: DC | PRN
Start: 2021-04-21 — End: 2021-04-23
  Administered 2021-04-22: 4000 [IU] via INTRAVENOUS
  Filled 2021-04-21 (×2): qty 1

## 2021-04-21 MED ORDER — VH HEPARIN SODIUM (PORCINE) 1000 UNIT/ML IJ SOLN (IR NARRATOR)
INTRAMUSCULAR | Status: AC | PRN
Start: 2021-04-21 — End: 2021-04-21
  Administered 2021-04-21: 3000 [IU] via INTRAVENOUS

## 2021-04-21 MED ORDER — INSULIN LISPRO (1 UNIT DIAL) 100 UNIT/ML SC SOPN
1.0000 [IU] | PEN_INJECTOR | Freq: Three times a day (TID) | SUBCUTANEOUS | Status: DC
Start: 2021-04-21 — End: 2021-04-24
  Administered 2021-04-22: 12:00:00 1 [IU] via SUBCUTANEOUS
  Filled 2021-04-21: qty 3

## 2021-04-21 MED ORDER — FENTANYL CITRATE (PF) 50 MCG/ML IJ SOLN (WRAP)
INTRAMUSCULAR | Status: AC
Start: 2021-04-21 — End: ?
  Filled 2021-04-21: qty 2

## 2021-04-21 MED ORDER — VH NURSING CARE MEDICATION PROTOCOL PLACEHOLDER
1.0000 | Status: DC
Start: 2021-04-21 — End: 2021-04-22

## 2021-04-21 MED ORDER — IOHEXOL 350 MG/ML IV SOLN
70.0000 mL | Freq: Once | INTRAVENOUS | Status: AC | PRN
Start: 2021-04-21 — End: 2021-04-21
  Administered 2021-04-21: 70 mL via INTRAVENOUS

## 2021-04-21 MED ORDER — ACETAMINOPHEN 160 MG/5ML PO SOLN
650.0000 mg | ORAL | Status: DC | PRN
Start: 2021-04-21 — End: 2021-04-24

## 2021-04-21 MED ORDER — SODIUM CHLORIDE FLUSH 0.9 % IV SOLN
5.0000 mL | Freq: Three times a day (TID) | INTRAVENOUS | Status: DC
Start: 2021-04-21 — End: 2021-04-24
  Administered 2021-04-21 – 2021-04-22 (×4): 10 mL
  Administered 2021-04-23 – 2021-04-24 (×3): 5 mL

## 2021-04-21 MED ORDER — INSULIN LISPRO (1 UNIT DIAL) 100 UNIT/ML SC SOPN
1.0000 [IU] | PEN_INJECTOR | Freq: Every day | SUBCUTANEOUS | Status: DC | PRN
Start: 2021-04-21 — End: 2021-04-24

## 2021-04-21 MED ORDER — SODIUM CHLORIDE (PF) 0.9 % IJ SOLN
3.0000 mL | Freq: Two times a day (BID) | INTRAMUSCULAR | Status: DC
Start: 2021-04-21 — End: 2021-04-24
  Administered 2021-04-21 – 2021-04-23 (×5): 3 mL via INTRAVENOUS

## 2021-04-21 MED ORDER — ACETAMINOPHEN 325 MG PO TABS
650.0000 mg | ORAL_TABLET | ORAL | Status: DC | PRN
Start: 2021-04-21 — End: 2021-04-24
  Administered 2021-04-22 – 2021-04-24 (×2): 650 mg via ORAL
  Filled 2021-04-21 (×2): qty 2

## 2021-04-21 MED ORDER — MIDAZOLAM HCL 1 MG/ML IJ SOLN (WRAP)
INTRAMUSCULAR | Status: AC
Start: 2021-04-21 — End: ?
  Filled 2021-04-21: qty 2

## 2021-04-21 MED ORDER — GLUCAGON 1 MG IJ SOLR (WRAP)
1.0000 mg | INTRAMUSCULAR | Status: DC | PRN
Start: 2021-04-21 — End: 2021-04-24

## 2021-04-21 MED ORDER — NICOTINE 21 MG/24HR TD PT24
1.0000 | MEDICATED_PATCH | Freq: Every day | TRANSDERMAL | Status: DC
Start: 2021-04-21 — End: 2021-04-24
  Administered 2021-04-21 – 2021-04-24 (×4): 1 via TRANSDERMAL
  Filled 2021-04-21 (×3): qty 1

## 2021-04-21 MED ORDER — FENTANYL CITRATE (PF) 50 MCG/ML IJ SOLN (WRAP)
INTRAMUSCULAR | Status: AC | PRN
Start: 2021-04-21 — End: 2021-04-21
  Administered 2021-04-21: 25 ug via INTRAVENOUS

## 2021-04-21 MED ORDER — VH MIDAZOLAM HCI 2 MG/2ML IJ SOLN (IR NARRATOR)
INTRAMUSCULAR | Status: AC | PRN
Start: 2021-04-21 — End: 2021-04-21
  Administered 2021-04-21: 0.5 mg via INTRAVENOUS

## 2021-04-21 MED ORDER — MIDAZOLAM HCL 1 MG/ML IJ SOLN (WRAP)
1.0000 mg | Freq: Once | INTRAMUSCULAR | Status: AC
Start: 2021-04-21 — End: 2021-04-21
  Administered 2021-04-21: 1 mg via INTRAVENOUS
  Filled 2021-04-21: qty 2

## 2021-04-21 MED ORDER — HEPARIN SODIUM (PORCINE) 10000 UNIT/ML IJ SOLN
INTRAMUSCULAR | Status: AC
Start: 2021-04-21 — End: ?
  Filled 2021-04-21: qty 1

## 2021-04-21 MED ORDER — INSULIN LISPRO (1 UNIT DIAL) 100 UNIT/ML SC SOPN
1.0000 [IU] | PEN_INJECTOR | Freq: Every evening | SUBCUTANEOUS | Status: DC
Start: 2021-04-21 — End: 2021-04-24
  Administered 2021-04-22: 21:00:00 1 [IU] via SUBCUTANEOUS

## 2021-04-21 MED ORDER — SODIUM CHLORIDE (PF) 0.9 % IJ SOLN
0.4000 mg | INTRAMUSCULAR | Status: DC | PRN
Start: 2021-04-21 — End: 2021-04-24

## 2021-04-21 MED ORDER — VH IODIXANOL 320 MG/ML IV SOLN (IR NARRATOR)
INTRAVENOUS | Status: AC | PRN
Start: 2021-04-21 — End: 2021-04-21
  Administered 2021-04-21: 85 mL via INTRAVENOUS

## 2021-04-21 MED ORDER — VH HEPARIN (PORCINE) IN D5W 50-5 UNIT/ML-% IV SOLN
0.0000 [IU]/h | INTRAVENOUS | Status: DC
Start: 2021-04-21 — End: 2021-04-23
  Administered 2021-04-21: 1750 [IU]/h via INTRAVENOUS
  Administered 2021-04-22: 1900 [IU]/h via INTRAVENOUS
  Administered 2021-04-22: 1650 [IU]/h via INTRAVENOUS
  Administered 2021-04-23: 2000 [IU]/h via INTRAVENOUS
  Filled 2021-04-21 (×3): qty 500

## 2021-04-21 MED ORDER — VH HEPARIN SODIUM (PORCINE) 10000 UNIT/ML IJ SOLN (INITIAL BOLUS)
9000.0000 [IU] | Freq: Once | INTRAMUSCULAR | Status: AC
Start: 2021-04-21 — End: 2021-04-21
  Administered 2021-04-21: 9000 [IU] via INTRAVENOUS

## 2021-04-21 MED ORDER — VH DEXTROSE 10 % IV BOLUS (ADULT)
125.0000 mL | INTRAVENOUS | Status: DC | PRN
Start: 2021-04-21 — End: 2021-04-24

## 2021-04-21 MED ORDER — ACETAMINOPHEN 325 MG PO TABS
650.0000 mg | ORAL_TABLET | Freq: Four times a day (QID) | ORAL | Status: DC | PRN
Start: 2021-04-21 — End: 2021-04-21

## 2021-04-21 NOTE — Plan of Care (Addendum)
NURSE NOTE SUMMARY  [facility] - CRITICAL CARE 2   Patient Name: Gary Vazquez,[name]   Attending Physician: Ronny Flurry, DO   Today's date:   04/21/2021 LOS: 0 days   Shift Summary:                                                              2000: Pt received from IR for pulmonary thrombectomy, procedure went well.  Access was through the right femoral vein with sutured closure.  Strict bedrest for 3 hours.  2300: Pt passed 3 Oz swallow.  Bag lunch provided.    EOS:  Uneventful shift.  Puncture site stable.  Neurovascular assessments charted.  Report given to oncoming shift.   Provider Notifications:        Rapid Response Notifications:  Mobility:      PMP Activity: Step 3 - Bed Mobility (04/21/2021  4:00 PM)     Weight tracking:  Family Dynamic:   Last 3 Weights for the past 72 hrs (Last 3 readings):   Weight   04/21/21 1623 108.6 kg (239 lb 6.7 oz)   04/21/21 1045 111.1 kg (245 lb)             Last Bowel Movement   Last BM Date: 04/21/21        Problem: Moderate/High Fall Risk Score >5  Goal: Patient will remain free of falls  Outcome: Progressing     Problem: Compromised Hemodynamic Status  Goal: Vital signs and fluid balance maintained/improved  Outcome: Progressing     Problem: Inadequate Airway Clearance  Goal: Normal respiratory rate/effort achieved/maintained  Outcome: Progressing     Problem: Compromised Tissue integrity  Goal: Damaged tissue is healing and protected  Outcome: Progressing  Goal: Nutritional status is improving  Outcome: Progressing

## 2021-04-21 NOTE — ED Provider Notes (Signed)
Clinical Impression  1. Multiple subsegmental pulmonary emboli without acute cor pulmonale    2. Primary hypertension          Pt presents with increased rle swelling and increased exertional dyspnea with recent chest pains that on imaging is found to have a large PE without overt right heart strain however does have bilateral straddle emboli and subsegmental. Reviewed results with pt and impression. Needs to be admitted for further continued mgmt. All questions addressed.     Disposition:  ED Disposition       ED Disposition   Admit    Condition   --    Date/Time   Sat Apr 21, 2021  2:05 PM    Comment   Service: Medicine [106]                 Dimple Casey, MD  04/21/2021 2:05 PM        Chief Complaint   Patient presents with   . Leg Swelling        HPI  Pt is a 55 yo male who noted increased right calf pain and swelling. There was an episode wed pushing out carts when developed notable soa that has overall improved (though family suggests it was noticeable today) but persists in having right calf pain an dswelling of the right lower leg. No n/t/w. No warmth. 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 No new cough, chest pain but some soa as noted and some left arm weakness several days ago.       History:   Past Medical History:   Diagnosis Date   . Abscess    . Arthritis    . HTN (hypertension)    . Hyperlipidemia      Past Surgical History:   Procedure Laterality Date   . ABDOMINAL SURGERY     . APPENDECTOMY (OPEN)     . APPENDECTOMY (OPEN)     . LAPAROSCOPIC, CHOLECYSTECTOMY, CHOLANGIOGRAM N/A 06/27/2014    Procedure: LAPAROSCOPIC, CHOLECYSTECTOMY, CHOLANGIOGRAM;  Surgeon: Tyson Alias, MD;  Location: [facility];  Service: General;  Laterality: N/A;   . spleen surgery      not splenectomy     Family History   Problem Relation Age of Onset   . Diabetes Father    . Heart attack Father    . Heart  disease Father    . Hyperlipidemia Father    . Hypertension Father        Social History     Tobacco Use   . Smoking status: Every Day     Packs/day: 1.00     Years: 32.00     Pack years: 32.00     Types: Cigarettes   . Smokeless tobacco: Never   Vaping Use   . Vaping Use: Never used   Substance Use Topics   . Alcohol use: Not Currently   . Drug use: Yes     Types: Marijuana, Other-see comments     Comment: marijuana daily in the evenings       Review of Systems   Constitutional: Negative.    HENT: Negative.     Respiratory:  Positive for shortness of breath. Negative for chest tightness.    Cardiovascular:  Positive for leg swelling. Negative for chest pain and palpitations.   Gastrointestinal: Negative.    Endocrine: Negative for polydipsia.   Genitourinary: Negative.  Musculoskeletal: Negative.    Skin: Negative.    Allergic/Immunologic: Negative for immunocompromised state.   Neurological: Negative.    Hematological: Negative.      ED Triage Vitals [04/21/21 1045]   Enc Vitals Group      BP (!) 153/98      Heart Rate (!) 112      Resp Rate 22      Temp 98.4 F (36.9 C)      Temp src       SpO2 98 %      Weight 111.1 kg      Height 1.88 m      Head Circumference       Peak Flow       Pain Score 9      Pain Loc       Pain Edu?       Excl. in GC?        Physical Exam  Vitals and nursing note reviewed.   Constitutional:       General: He is not in acute distress.     Appearance: Normal appearance. He is obese. He is not ill-appearing or toxic-appearing.   HENT:      Nose: Nose normal.      Mouth/Throat:      Mouth: Mucous membranes are moist.      Pharynx: Oropharynx is clear.   Eyes:      Conjunctiva/sclera: Conjunctivae normal.   Cardiovascular:      Rate and Rhythm: Regular rhythm. Tachycardia present.      Heart sounds: No murmur heard.  Pulmonary:      Effort: Pulmonary effort is normal.      Breath sounds: Normal breath sounds. No wheezing, rhonchi or rales.   Musculoskeletal:         General: No signs of  injury.      Right lower leg: Edema present.      Left lower leg: No edema.      Comments: Ttp with some lymphedema to rle w/o cellulitic changes and intact dp. No redness/warmth; Homan's positive   Skin:     General: Skin is warm and dry.      Capillary Refill: Capillary refill takes less than 2 seconds.      Coloration: Skin is not jaundiced.      Findings: No rash.   Neurological:      General: No focal deficit present.      Mental Status: He is alert and oriented to person, place, and time.       ED Course as of 04/21/21 1505   Sat Apr 21, 2021   1204 Istat normal renal function  [GM]   1224 Cbc nromal  [GM]   1240 D-Dimer(!): 6.23 [GM]   1453 B-Natriuretic Peptide: 84.3  Not notably elevated [GM]   1505 Sinus tach 110 [GM]      ED Course User Index  [GM] Dimple Casey, MD       Reviewed patient's previous medical records including but not limited to [facility] previous discharge summary, [facility] previous imaging studies, and [facility] previous ED visits    Reviewed nursing notes, updated vitals/assessments.     POCUS n/a       Procedures    Review previous pertinent visits and Review previous pertinent diagnostics       Dimple Casey, MD  04/21/21 [phone]

## 2021-04-21 NOTE — Progress Notes (Signed)
NURSE NOTE SUMMARY  [facility] - [facility] INTERVENTIONAL IMAGING   Patient Name: Gary Vazquez,[name]   Attending Physician: Ronny Flurry, DO   Today's date:   04/21/2021 LOS: 0 days   Shift Summary:                                                                1700: Patient admitted to unit from ED. Noticeable shortness of breath when conversing. O2 sats remain stable in high 90's on room air. Mild rhonchi noted to right anterior lobe, otherwise clear/diminished. Leg swelling to RLE, stating a throbbing pain. +4/+3 pulses. Thrombectomy scheduled for this evening. Very anxious about diagnosis and upcoming procedure. Versed 1mg  given.     1800: Son at bedside and stated that he was worried patient would start to withdrawal from suboxone use. Patient confessed to daily moderate dose of suboxone. Will monitor for symptoms.     1825: Taken to IR for procedure.      Provider Notifications:        Rapid Response Notifications:  Mobility:      PMP Activity: Step 3 - Bed Mobility (04/21/2021  4:00 PM)     Weight tracking:  Family Dynamic:   Last 3 Weights for the past 72 hrs (Last 3 readings):   Weight   04/21/21 1623 108.6 kg (239 lb 6.7 oz)   04/21/21 1045 111.1 kg (245 lb)             Last Bowel Movement   Last BM Date: 04/21/21

## 2021-04-21 NOTE — Consults (Signed)
[facility]  Consult Note    Patient Name: Gary Vazquez,[name]    Attending Physician: Ronny Flurry, DO                                             LOS:  0 DAYS   Primary Care Physician: Pcp, None, MD                                                         ROOM# [phone]   Assessment /  Summary    47y male with pmhx of HTN, HLD, chronic tobacco abuse presents to [facility] with right lower extremity pain.  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  Wednesday he was no longer short of breath but was experiencing right leg calf pain.  Pain in his leg continued to worsen prompting him to come to the ED this afternoon for further evaluation.  CTA chest was obtained which showed a large saddle embolus and multiple emboli in the branch vessels with evidence of right heart strain.  Patient denies prolonged immobilization, recent long car or plane rides.  No prior history of blood clots, or cancer.  No family history.  Patient does no regularly following with a PCP and is not up to date with routine cancer screenings.  He smokes approximately 1 pack per day.  Son states he has a history of chronic marijuana use.    IR consulted by Hospitalist service for evaluation for mechanical thrombectomy.     Assessment / Recommendation -  Plan      Patient Active Hospital Problem List:  Acute saddle pulmonary embolus (04/21/2021).  CT chest: Large saddle embolus in the main dome area arteries and multiple emboli in the branch vessels. Right heart strain.  Trop 0.05, BNP 84.3.  Dr. Waynetta Sandy discussed patient with IR for possible mechanical thrombectomy, tentative plan for intervention   -IR following, appreciate recs  -Continue heparin gtt  -Obtain Bilateral Lower extremity US   -Monitor troponin   -Obtain Echocardiogram   -NPO for now  -Recommend OP follow up for routine CA screenings    HTN, not currently on meds as OP  -Continue to monitor BP    Current everyday tobacco use,  1pk/day  -Nicotine patch  -Smoking cessation strongly encouraged     Bilateral adrenal gland enlargement, per CT scan   -Recommend OP follow up     Elevated BG, hx of pre-diabetes  -Continue ISS, monitor FS AC/HS  -Check A1C      General ICU Assessment / Plans - if applicable    Vascular access: Peripherals.     GI Prophylaxis: None needed.  Nutrition: NPO.                Sedation: None needed.  Foley Catheter: not needed  VTE Prophylaxis:   IV heparin    Other:  Code Status: full code  Disposition: To [facility] under [facility]        Tubes/Lines/Airway              Patient Lines/Drains/Airways Status       Active PICC Line / CVC Line / PIV Line /  Drain / Airway / Intraosseous Line / Epidural Line / ART Line / Line / Wound / Pressure Ulcer / NG/OG Tube       Name Placement date Placement time Site Days    Peripheral IV 04/21/21 20 G Right Antecubital 04/21/21  1136  Antecubital  less than 1                        HISTORY    Review of Systems   Constitutional:  Negative for chills, fever and malaise/fatigue.   HENT:  Negative for sore throat.    Eyes:  Negative for blurred vision.   Respiratory:  Negative for shortness of breath and wheezing.    Cardiovascular:  Positive for leg swelling. Negative for chest pain, palpitations and orthopnea.        Right lower leg swelling    Gastrointestinal:  Negative for abdominal pain, constipation, diarrhea, heartburn, nausea and vomiting.   Genitourinary:  Negative for dysuria, frequency and urgency.   Musculoskeletal:  Negative for joint pain and myalgias.        Right leg pain    Skin:  Negative for itching and rash.   Neurological:  Negative for dizziness, tingling, focal weakness, loss of consciousness, weakness and headaches.   Endo/Heme/Allergies:  Does not bruise/bleed easily.   Psychiatric/Behavioral:  Negative for substance abuse. The patient is nervous/anxious.      PAST MEDICAL HISTORY    Past Medical Hx:    Past Medical History:   Diagnosis Date    Abscess      Arthritis     HTN (hypertension)     Hyperlipidemia        Past Surgical Hx:    Past Surgical History:   Procedure Laterality Date    ABDOMINAL SURGERY      APPENDECTOMY (OPEN)      APPENDECTOMY (OPEN)      LAPAROSCOPIC, CHOLECYSTECTOMY, CHOLANGIOGRAM N/A 06/27/2014    Procedure: LAPAROSCOPIC, CHOLECYSTECTOMY, CHOLANGIOGRAM;  Surgeon: Tyson Alias, MD;  [facility];  Service: General;  Laterality: N/A;    spleen surgery      not splenectomy       Family History:    Family History   Problem Relation Age of Onset    Diabetes Father     Heart attack Father     Heart disease Father     Hyperlipidemia Father     Hypertension Father        SOCIAL History:    Social History     Socioeconomic History    Marital status: Married   Tobacco Use    Smoking status: Every Day     Packs/day: 1.00     Years: 32.00     Pack years: 32.00     Types: Cigarettes    Smokeless tobacco: Never   Vaping Use    Vaping Use: Never used   Substance and Sexual Activity    Alcohol use: Not Currently    Drug use: Yes     Types: Marijuana, Other-see comments     Comment: marijuana daily in the evenings    Sexual activity: Yes         PHYSICAL EXAM   Vitals: BP (!) 130/93   Pulse (!) 111   Temp 98.4 F (36.9 C)   Resp (!) 33   Ht 1.88 m (6\' 2" )   Wt 111.1 kg (245 lb)   SpO2 96%   BMI 31.46 kg/m  Admission Weight: Weight: 111.1 kg (245 lb)  Last Weight:   Wt Readings from Last 1 Encounters:   04/21/21 111.1 kg (245 lb)         Body mass index is 31.46 kg/m.  I/O: No intake or output data in the 24 hours ending 04/21/21 1601  Vent settings:      Physical Exam  Vitals and nursing note reviewed.   Constitutional:       General: He is not in acute distress.     Appearance: Normal appearance. He is normal weight. He is not ill-appearing, toxic-appearing or diaphoretic.   HENT:      Head: Normocephalic and atraumatic.      Right Ear: External ear normal.      Left Ear: External ear normal.      Nose: No congestion or rhinorrhea.       Mouth/Throat:      Mouth: Mucous membranes are dry.      Pharynx: Oropharynx is clear. No oropharyngeal exudate or posterior oropharyngeal erythema.   Eyes:      General: No scleral icterus.        Right eye: No discharge.         Left eye: No discharge.      Extraocular Movements: Extraocular movements intact.      Conjunctiva/sclera: Conjunctivae normal.      Pupils: Pupils are equal, round, and reactive to light.   Cardiovascular:      Rate and Rhythm: Regular rhythm. Tachycardia present.      Pulses: Normal pulses.      Heart sounds: No murmur heard.    No friction rub. No gallop.   Pulmonary:      Effort: Pulmonary effort is normal. No respiratory distress.      Breath sounds: Normal breath sounds. No stridor. No wheezing or rales.   Abdominal:      General: Bowel sounds are normal. There is no distension.      Palpations: Abdomen is soft.      Tenderness: There is no abdominal tenderness. There is no guarding.   Musculoskeletal:         General: No swelling or tenderness. Normal range of motion.      Cervical back: Normal range of motion and neck supple. No rigidity. No muscular tenderness.      Right lower leg: Edema present.      Left lower leg: No edema.   Lymphadenopathy:      Cervical: No cervical adenopathy.   Skin:     General: Skin is warm and dry.      Capillary Refill: Capillary refill takes less than 2 seconds.      Coloration: Skin is not pale.   Neurological:      General: No focal deficit present.      Mental Status: He is alert and oriented to person, place, and time. Mental status is at baseline.      Cranial Nerves: No cranial nerve deficit.      Sensory: No sensory deficit.   Psychiatric:         Mood and Affect: Mood is anxious.         Behavior: Behavior normal.         Thought Content: Thought content normal.        LABS   Labs Reviewed:  CBC:   Recent Labs   Lab 04/21/21  1134   WBC 10.8   Hemoglobin 16.0   Hematocrit 48.0  PLT CT 193         Coags:   Recent Labs   Lab  04/21/21  1134   PT 10.9   PT INR 1.0   aPTT 27.0         ABGs:  No results found for: ABGCOLLECTIO, ALLENSTEST, PHART, PCO2ART, PO2ART, HCO3ART, BEART, O2SATART    TCO2 I-Stat   Date Value Ref Range Status   04/21/2021 27 24 - 29 mMol/L Final   07/18/2013 22 (L) 24 - 29 mMol/L Final        Chemistry:   Recent Labs     04/21/21  1141 04/21/21  1134   Sodium  --  139   Potassium  --  4.3   Chloride  --  102   CO2  --  27.4   BUN  --  15   Creatinine  --  0.92   Creatinine I-Stat 0.90  --    EGFR 100 97   Glucose  --  192*   Calcium  --  9.3       LFTs:   Recent Labs   Lab 04/21/21  1134   Glucose 192*              Other:    RADIOLOGY / IMAGING   Imaging personally reviewed by me, including: Chest CT        ATTESTATION & BILLING      Patient's condition and plan discussed with:  Dr. Berton Bon, patient, family, referring provider, and bedside nurse    Billing:  Critical care time: 41 minutes    I managed/supervised life or organ supporting interventions that required frequent physician assessments. I devoted my full attention in the ICU to the direct care of this patient for this period of time while critically ill.    Any critical care time was performed today and is exclusive of teaching, billable procedures, and not overlapping with any other providers.    Signed by: Chalmers Cater, NP   cc:Pcp, None, MD

## 2021-04-21 NOTE — Progress Notes (Signed)
Inteventional Radiology Pre Procedure Evaluation.    Procedure requested: Pulmonary thrombectomy.     Indication:  Saddle PE. High risk.    Findings:  Saddle PE.  Right heart strain.     Plan: Will perform thrombectomy now.       Bebe Liter M.D.  [phone]

## 2021-04-21 NOTE — H&P (Signed)
Medicine History & Physical   [facility]  [facility]   Patient Name: Gary Vazquez, Gary Vazquez. LOS: 0 days   Attending Physician: Dimple Casey, MD PCP: Pcp, None, MD      Assessment and Plan:                                                              Acute saddle embolus with multiple peripheral emboli  Acute right leg DVT  Hypercoagulable state due to VTE  -Troponin 0.05, BNP 84, HR 110, RR 26, D-dimer 6.23  -CTA of chest:  Large saddle embolus in the main dome area arteries and multiple emboli in the branch vessels.  Right heart strain.  Hepatic steatosis  -Heparin drip initiated  -Appreciate recommendation and guidance from intensivist  -Appreciate recommendation and guidance from IR Dr. Anselm Jungling for possible thrombectomy vs. guided tPA treatment  -Per IR Dr. Anselm Jungling, COVID preprocedure ordered and NPO for now  -Venous duplex study to bilateral legs ordered    Elevated troponin in setting of acute saddle embolus  -Troponin 0.05, will trend  -12-lead EKG:  Sinus tachycardia, heart rate 110, normal axis, no ST changes noted.  Difficult to assess for Q wave changes due to motion artifact noted in multiple leads.  No previous EKG to compare.  PR 152 and QTc 408    Tobacco dependence  -At least 40 pack per year smoking history  -Counseled on smoking cessation and nicotine patch order    Hyperglycemia without DM  -Glucose 192, A1C added  -Accucheck with sliding scale insulin coverage    Incidental adrenal gland enlargement on imaging  -CT angio of the chest:  Bilateral adrenal gland enlargement left greater than right   -Recommend outpatient follow-up for further evaluation with PCP      DVT PPx: Heparin drip  Dispo: Inpatient  Healthcare Proxy: Son Roshon Duell  Code: full code     History of Presenting Illness                                CC: Worsening shortness of breath and right leg pain    Gary Vazquez. is a 55 y.o. male patient past medical history of hypertension, hyperlipidemia not on  any medication, tobacco dependence and daily marijuana use presented to the emergency room for progressive right leg pain with swelling that started Wednesday.  Patient found to be tachycardic as well as obvious swelling to the right leg so CT angio of the chest done.  CT angio of the chest noted a saddle embolus with multiple peripheral pulmonary embolism.  Duplex venous study bilateral legs ordered.  Heparin drip initiated.  Due to CT finding, IR physician Dr. Anselm Jungling consulted for possible thrombectomy versus cath guided tPA.  Intensivist was consulted who agreeable to admit patient to [facility] for closer observation.    Patient examined at bedside today by this physician.  Patient denies any headache, vision changes, fever, chest pain, palpitation, wheezing, trauma to legs, long car rides, flight, lack of movement, abdominal pain, nausea, vomiting, dysuria.  Patient reported he noticed shortness of breath upon exertion on Tuesday.  He reported he was able to take deep breaths but with normal breath noted  some wheezing as well.  He thought since he smoked this was not concerning.  By Wednesday morning his breathing was back to normal.  He did notice some swelling to the right leg that progressed.  He also noted worsening pain as well.  The son today was able to convince patient to come to the emergency room for evaluation.  He was able to recall that the woman that he was doing some housework for noticed him appearing pale on Tuesday.  He said she told this to him on Wednesday.  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    Past Medical History:   Diagnosis Date    Abscess     Arthritis     HTN (hypertension)     Hyperlipidemia      Past Surgical History:   Procedure Laterality Date    ABDOMINAL SURGERY      APPENDECTOMY (OPEN)      APPENDECTOMY (OPEN)      LAPAROSCOPIC, CHOLECYSTECTOMY, CHOLANGIOGRAM N/A 06/27/2014    Procedure: LAPAROSCOPIC, CHOLECYSTECTOMY, CHOLANGIOGRAM;   Surgeon: Tyson Alias, MD;  Location: [facility];  Service: General;  Laterality: N/A;    spleen surgery      not splenectomy       Family History   Problem Relation Age of Onset    Diabetes Father     Heart attack Father     Heart disease Father     Hyperlipidemia Father     Hypertension Father      Social History     Tobacco Use    Smoking status: Every Day     Packs/day: 1.00     Years: 32.00     Pack years: 32.00     Types: Cigarettes    Smokeless tobacco: Never   Vaping Use    Vaping Use: Never used   Substance Use Topics    Alcohol use: Not Currently    Drug use: Yes     Types: Marijuana, Other-see comments     Comment: marijuana daily in the evenings            Subjective     Review of Systems:  Review of systems as noted in HPI. Full 12 point ROS otherwise negative.         Objective   Physical Exam:   Examination done with appropriate PPE including eye goggles mask  Vitals: T:98.4 F (36.9 C),  BP:(!) 154/96, HR:(!) 107, RR:(!) 27, SaO2:96%    1) General Appearance: Alert and oriented x self, time, place and situation. In no acute distress.   2) Eyes: Pink conjunctiva, anicteric sclera.  PERRLA 3 mm brisk bilaterally  3) ENT: Oral mucosa moist with no pharyngeal congestion, erythema or swelling.  4) Neck: Supple, with full range of motion. Trachea is central, no JVD noted  5) Chest: Clear to auscultation bilaterally, no wheezes or rhonchi.  6) CVS: Tachycardic rate and regular rhythm, with no murmurs.  7) Abdomen: Soft, non-tender, no palpable mass. Bowel sounds normal. No CVA tenderness  8) Extremities: No pitting edema, pulses palpable, no calf swelling and no gross deformity.  9) Skin: Warm, dry with normal skin turgor, no rash  10) Lymphatics: No lymphadenopathy in axillary, cervical and inguinal area.   11) Neurological: Cranial nerves II-XII intact. No gross focal motor or sensory deficits noted.  Clear speech noted.  Equal sensation bilaterally.  Spontaneous purposeful movement all 4  extremities and clear  speech noted.  12) Psychiatric: Affect is appropriate.  Anxious appearing    EKG: Per my review shows Sinus tachycardia, heart rate 110, normal axis, no ST changes noted.  Difficult to assess for Q wave changes due to motion artifact noted in multiple leads.  No previous EKG to compare.  PR 152 and QTc 408      Patient Vitals for the past 12 hrs:   BP Temp Pulse Resp   04/21/21 1404 (!) 154/96 -- (!) 107 (!) 27   04/21/21 1326 -- -- 99 15   04/21/21 1045 (!) 153/98 98.4 F (36.9 C) (!) 112 22     Weight Monitoring 06/19/2014 06/27/2014 08/27/2014 09/14/2014 11/18/2014 02/05/2016 04/21/2021   Height 188 cm 188 cm 188 cm 188 cm - 182.9 cm 188 cm   Height Method Actual Stated Stated Stated - Stated Stated   Weight 95.7 kg 95.5 kg 96.2 kg 92.6 kg 92.2 kg 94.3 kg 111.131 kg   Weight Method Actual Actual Actual Actual Actual Actual Actual   BMI (calculated) 27.1 kg/m2 27.1 kg/m2 27.3 kg/m2 26.3 kg/m2 - 28.3 kg/m2 31.5 kg/m2           LABS:  Estimated Creatinine Clearance: 120.1 mL/min (based on SCr of 0.9 mg/dL).   Recent Results (from the past 24 hour(s))   Chem 8 plus only (i-STAT)    Collection Time: 04/21/21 11:34 AM   Result Value Ref Range    I-STAT Notification Istat Notification    CBC and differential    Collection Time: 04/21/21 11:34 AM   Result Value Ref Range    WBC 10.8 4.0 - 11.0 K/cmm    RBC 4.94 4.00 - 5.70 M/cmm    Hemoglobin 16.0 13.0 - 17.5 gm/dL    Hematocrit 16.1 09.6 - 52.5 %    MCV 97 80 - 100 fL    MCH 32 28 - 35 pg    MCHC 33 32 - 36 gm/dL    RDW 04.5 40.9 - 81.1 %    PLT CT 193 130 - 440 K/cmm    MPV 8.0 6.0 - 10.0 fL    Neutrophils % 72.5 42.0 - 78.0 %    Lymphocytes 16.0 15.0 - 46.0 %    Monocytes 8.5 3.0 - 15.0 %    Eosinophils % 2.2 0.0 - 7.0 %    Basophils % 0.8 0.0 - 3.0 %    Neutrophils Absolute 7.8 1.7 - 8.6 K/cmm    Lymphocytes Absolute 1.7 0.6 - 5.1 K/cmm    Monocytes Absolute 0.9 0.1 - 1.7 K/cmm    Eosinophils Absolute 0.2 0.0 - 0.8 K/cmm    Basophils Absolute 0.1  0.0 - 0.3 K/cmm   Basic Metabolic Panel    Collection Time: 04/21/21 11:34 AM   Result Value Ref Range    Sodium 139 136 - 147 mMol/L    Potassium 4.3 3.5 - 5.3 mMol/L    Chloride 102 98 - 110 mMol/L    CO2 27.4 20.0 - 30.0 mMol/L    Calcium 9.3 8.5 - 10.5 mg/dL    Glucose 914 (H) 71 - 99 mg/dL    Creatinine 7.82 9.56 - 1.30 mg/dL    BUN 15 7 - 22 mg/dL    Anion Gap 21.3 7.0 - 18.0 mMol/L    BUN / Creatinine Ratio 16.3 10.0 - 30.0 Ratio    EGFR 97 60 - 150 mL/min/1.87m2    Osmolality Calculated 284 275 - 300 mOsm/kg   D-dimer, quantitative  Collection Time: 04/21/21 11:34 AM   Result Value Ref Range    D-Dimer 6.23 (H) 0.19 - 0.52 mg/L FEU   Prothrombin time/INR    Collection Time: 04/21/21 11:34 AM   Result Value Ref Range    PT 10.9 9.4 - 11.5 sec    PT INR 1.0 0.9 - 1.1   APTT    Collection Time: 04/21/21 11:34 AM   Result Value Ref Range    aPTT 27.0 24.0 - 34.0 sec   Troponin I    Collection Time: 04/21/21 11:34 AM   Result Value Ref Range    Troponin I 0.05 (H) 0.00 - 0.02 ng/mL   B-type Natriuretic Peptide    Collection Time: 04/21/21 11:34 AM   Result Value Ref Range    B-Natriuretic Peptide 84.3 0.0 - 100.0 pg/mL   i-Stat Chem 8 CartrIDge    Collection Time: 04/21/21 11:41 AM   Result Value Ref Range    Sodium I-Stat 138 136 - 147 mMol/L    Potassium I-Stat 4.2 3.5 - 5.3 mMol/L    Chloride I-Stat 99 98 - 110 mMol/L    TCO2 I-Stat 27 24 - 29 mMol/L    Calcium Ionized I-Stat 4.70 4.35 - 5.10 mg/dL    Glucose I-Stat 161 (H) 71 - 99 mg/dL    Creatinine I-Stat 0.96 0.80 - 1.30 mg/dL    BUN I-Stat 16 7 - 22 mg/dL    Anion Gap I-Stat 04.5 (H) 7.0 - 16.0    EGFR 100 60 - 150 mL/min/1.64m2    Hematocrit I-Stat 50.0 39.0 - 52.5 %    Hemoglobin I-Stat 17.0 13.0 - 17.5 gm/dL          Allergies   Allergen Reactions    Morphine     Vicodin [Hydrocodone-Acetaminophen]       CT Angiogram Chest (PE)    Result Date: 04/21/2021  1. Large saddle embolus in the main dome area arteries and multiple emboli in the branch vessels.  2. Right heart strain. 3. Hepatic steatosis 4. No focal infiltrates or pleural effusions 5. Bilateral adrenal gland enlargement left greater than right question underlying hyperplasia, carcinoma or adenomas further investigation suggested. Results phoned to ER physician. ReadingStation:[id number]      Home Medications       Med List Status: In Progress Set By: Felecia Jan, RN at 04/21/2021 10:59 AM      No Medications           Meds given in the ED:  Medications   heparin 40981 units in dextrose 5% 500 mL infusion (premix) (1,750 Units/hr Intravenous New Bag 04/21/21 1420)   heparin (porcine) injection 4,000-9,000 Units (has no administration in time range)   iohexol (OMNIPAQUE) 350 MG/ML injection 70 mL (70 mLs Intravenous Imaging Agent Given 04/21/21 1353)   heparin (porcine) injection 9,000 Units (9,000 Units Intravenous Given 04/21/21 1415)      Time Spent:     [employer], DO     04/21/21,2:39 PM   MRN: [id number]                                      CSN: [id number] DOB: May 11, 1966

## 2021-04-21 NOTE — Plan of Care (Signed)
Problem: Moderate/High Fall Risk Score >5  Goal: Patient will remain free of falls  04/21/2021 1705 by Verlin Dike, RN  Outcome: Progressing  Flowsheets (Taken 04/21/2021 1600)  VH Moderate Risk (6-13):   ALL REQUIRED LOW INTERVENTIONS   INITIATE YELLOW "FALL RISK" SIGNAGE   YELLOW NON-SKID SLIPPERS   Include family/significant other in multidisciplinary discussion regarding plan of care as appropriate  04/21/2021 1705 by Verlin Dike, RN  Outcome: Progressing     Problem: Compromised Hemodynamic Status  Goal: Vital signs and fluid balance maintained/improved  Outcome: Progressing  Flowsheets (Taken 04/21/2021 1705)  Vital signs and fluid balance are maintained/improved:   Position patient for maximum circulation/cardiac output   Monitor/assess vitals and hemodynamic parameters with position changes   Monitor intake and output. Notify LIP if urine output is less than 30 mL/hour.   Monitor and compare daily weight   Monitor/assess lab values and report abnormal values     Problem: Inadequate Airway Clearance  Goal: Normal respiratory rate/effort achieved/maintained  Outcome: Progressing  Flowsheets (Taken 04/21/2021 1705)  Normal respiratory rate/effort achieved/maintained: Plan activities to conserve energy: plan rest periods

## 2021-04-21 NOTE — Procedures (Signed)
Inteventional Radiology    Procedure:  Catheter directed pulmonary arterial thromboembolectomy..    Indication:  High risk, high volume PE.    Findings:  Right CFV entry.  Gore sheaths inserted.  4 french Pigtail catheter was inserted and positioned in the pulmonary artery.  Angiogram showed bilateral clot.    Suction catheter was then inserted.  Large volume of clot was removed.  Post procedure angiogram showed improved perfusion of both lungs.      Complications: None.     Disposition:  Transfer back to nursing unit. .     Juston Goheen M.D.  [phone]

## 2021-04-21 NOTE — ED Notes (Signed)
Presents to the ED with c/o RLE swelling and pain since Tuesday. Pt reports woke up Tuesday AM and "my leg felt like a watermelon from the swelling". Denies injury to right leg. States had SOB on Tuesday evening. States is a daily smoker x1 pk/day. Denies recent traveling. Hx HTN, states does not take BP medication anymore due to "making me feel funny".     Patient appears SOB while speaking with this RN.

## 2021-04-21 NOTE — Progress Notes (Signed)
1950: Pt returned to unit from IR procedure. VSS.

## 2021-04-22 LAB — ECG 12-LEAD
P Wave Axis: 32 deg
P-R Interval: 152 ms
Patient Age: 55 years
Q-T Interval(Corrected): 408 ms
Q-T Interval: 301 ms
QRS Axis: 9 deg
QRS Duration: 84 ms
T Axis: 67 years
Ventricular Rate: 110 //min

## 2021-04-22 LAB — VH APTT( HEPARIN INFUSION THERAPY)
aPTT: 41.1 s — ABNORMAL HIGH (ref 24.0–34.0)
aPTT: 46.1 s — ABNORMAL HIGH (ref 24.0–34.0)
aPTT: 42.9 s — ABNORMAL HIGH (ref 24.0–34.0)

## 2021-04-22 LAB — BASIC METABOLIC PANEL
Anion Gap: 14.8 mMol/L (ref 7.0–18.0)
BUN / Creatinine Ratio: 17.3 Ratio (ref 10.0–30.0)
BUN: 13 mg/dL (ref 7–22)
CO2: 23 mMol/L (ref 20–30)
Calcium: 8.6 mg/dL (ref 8.5–10.5)
Chloride: 103 mMol/L (ref 98–110)
Creatinine: 0.75 mg/dL — ABNORMAL LOW (ref 0.80–1.30)
EGFR: 105 mL/min/{1.73_m2} (ref 60–150)
Glucose: 196 mg/dL — ABNORMAL HIGH (ref 71–99)
Osmolality Calculated: 279 mOsm/kg (ref 275–300)
Potassium: 3.8 mMol/L (ref 3.5–5.3)
Sodium: 137 mMol/L (ref 136–147)

## 2021-04-22 LAB — CBC AND DIFFERENTIAL
Basophils %: 0.7 % (ref 0.0–3.0)
Basophils Absolute: 0.1 10*3/uL (ref 0.0–0.3)
Eosinophils %: 3.4 % (ref 0.0–7.0)
Eosinophils Absolute: 0.3 10*3/uL (ref 0.0–0.8)
Hematocrit: 42.9 % (ref 39.0–52.5)
Hemoglobin: 13.9 gm/dL (ref 13.0–17.5)
Lymphocytes Absolute: 1.9 10*3/uL (ref 0.6–5.1)
Lymphocytes: 18.7 % (ref 15.0–46.0)
MCH: 32 pg (ref 28–35)
MCHC: 32 gm/dL (ref 32–36)
MCV: 98 fL (ref 80–100)
MPV: 8 fL (ref 6.0–10.0)
Monocytes Absolute: 0.9 10*3/uL (ref 0.1–1.7)
Monocytes: 8.8 % (ref 3.0–15.0)
Neutrophils %: 68.5 % (ref 42.0–78.0)
Neutrophils Absolute: 6.8 10*3/uL (ref 1.7–8.6)
PLT CT: 180 10*3/uL (ref 130–440)
RBC: 4.39 10*6/uL (ref 4.00–5.70)
RDW: 12.1 % (ref 11.0–14.0)
WBC: 10 10*3/uL (ref 4.0–11.0)

## 2021-04-22 LAB — VH DEXTROSE STICK GLUCOSE
Glucose POCT: 129 mg/dL — ABNORMAL HIGH (ref 71–99)
Glucose POCT: 161 mg/dL — ABNORMAL HIGH (ref 71–99)
Glucose POCT: 119 mg/dL — ABNORMAL HIGH (ref 71–99)
Glucose POCT: 184 mg/dL — ABNORMAL HIGH (ref 71–99)

## 2021-04-22 LAB — PHOSPHORUS: Phosphorus: 3.6 mg/dL (ref 2.3–4.7)

## 2021-04-22 LAB — MAGNESIUM: Magnesium: 2 mg/dL (ref 1.6–2.6)

## 2021-04-22 LAB — CALCIUM, IONIZED: Calcium, Ionized: 4.39 mg/dL (ref 4.35–5.10)

## 2021-04-22 MED ORDER — KETOROLAC TROMETHAMINE 30 MG/ML IJ SOLN
30.0000 mg | Freq: Once | INTRAMUSCULAR | Status: AC
Start: 2021-04-22 — End: 2021-04-22
  Administered 2021-04-22: 30 mg via INTRAVENOUS
  Filled 2021-04-22: qty 1

## 2021-04-22 MED ORDER — MAGNESIUM OXIDE 400 MG TABS (WRAP)
400.0000 mg | ORAL_TABLET | Freq: Four times a day (QID) | ORAL | Status: AC
Start: 2021-04-22 — End: 2021-04-22
  Administered 2021-04-22 (×2): 400 mg via ORAL
  Filled 2021-04-22 (×2): qty 1

## 2021-04-22 MED ORDER — VH POTASSIUM CHLORIDE CRYS ER 20 MEQ PO TBCR (WRAP)
20.0000 meq | EXTENDED_RELEASE_TABLET | Freq: Once | ORAL | Status: AC
Start: 2021-04-22 — End: 2021-04-22
  Administered 2021-04-22: 20 meq via ORAL
  Filled 2021-04-22: qty 1

## 2021-04-22 NOTE — Plan of Care (Signed)
Problem: Moderate/High Fall Risk Score >5  Goal: Patient will remain free of falls  Outcome: Progressing  Flowsheets (Taken 04/21/2021 2000 by Danne Baxter, RN)  VH Moderate Risk (6-13):   ALL REQUIRED LOW INTERVENTIONS   INITIATE YELLOW "FALL RISK" SIGNAGE   YELLOW NON-SKID SLIPPERS   YELLOW "FALL RISK" ARM BAND   USE OF BED EXIT ALARM IF PATIENT IS CONFUSED OR IMPULSIVE. PLACE RESET BED ALARM SIGN ABOVE BED   PLACE FALL RISK LEVEL ON WHITE BOARD FOR COMMUNICATION PURPOSES IN PATIENT'S ROOM   Include family/significant other in multidisciplinary discussion regarding plan of care as appropriate   Remain with patient during toileting   Request PT/OT therapy consult order from Physician for patients with gait/mobility impairment   Use assistive devices     Problem: Compromised Hemodynamic Status  Goal: Vital signs and fluid balance maintained/improved  Outcome: Progressing  Flowsheets (Taken 04/21/2021 1705)  Vital signs and fluid balance are maintained/improved:   Position patient for maximum circulation/cardiac output   Monitor/assess vitals and hemodynamic parameters with position changes   Monitor intake and output. Notify LIP if urine output is less than 30 mL/hour.   Monitor and compare daily weight   Monitor/assess lab values and report abnormal values

## 2021-04-22 NOTE — Progress Notes (Signed)
Date Time:  04/22/21 1:47 PM Attending: Jonn Shingles, MD   Pt. Name:  Gary Vazquez,[name]     PCP: Pcp, None, MD          INTERIM SUMMARY:  Pt. is a 55/M w/ PMhx of HTN, Substance abuse, continuos smoker. Presented to care d/t R. LE swelling w/ iscomfort. Evauated and then admitted to ICU when saddle-embolus noted on imaging. Underwent Thrombectomy w/ IR and transferred to medical floor.           SUBJECTIVE:  Pt. much more comfortable and doing well on heparin gtt s/p thrombectomy.  Does not report any inciting factor.         Objective :  Vitals:    04/22/21 1230 04/22/21 1245 04/22/21 1300 04/22/21 1315   BP: 131/74      Pulse: 93 89 95 87   Resp: 20 15 15 18    Temp:       TempSrc:       SpO2: 90% 93% 94% 95%   Weight:       Height:           Intake/Output Summary (Last 24 hours) at 04/22/2021 1347  Last data filed at 04/22/2021 1210  Gross per 24 hour   Intake --   Output 750 ml   Net -750 ml     Weight Monitoring 04/21/2021 04/21/2021 04/22/2021   Height 188 cm 188 cm -   Height Method Stated Stated -   Weight 111.131 kg 108.6 kg 106.9 kg   Weight Method Actual Bed Scale Bed Scale   BMI (calculated) 31.5 kg/m2 30.8 kg/m2 -       Body mass index is 30.26 kg/m.        MEDS: (SCHEDULED/INFUSIONS/PRN)  Current Facility-Administered Medications   Medication Dose Route Frequency    insulin lispro (1 Unit Dial)  1-9 Units Subcutaneous TID AC    And    insulin lispro (1 Unit Dial)  1-7 Units Subcutaneous QHS    magnesium oxide  400 mg Oral Q6H    nicotine  1 patch Transdermal Daily    nursing care medication protocol placeholder  1 each Does not apply See Admin Instructions    sodium chloride (PF)  3 mL Intravenous Q12H SCH    sodium chloride (PF)  3 mL Intravenous Q12H SCH    sodium chloride 0.9 % flush  5-10 mL Intracatheter Q8H     Current Facility-Administered Medications   Medication Dose Route Frequency Last Rate    heparin infusion - MAR calculator by aPTT   0-3,500 Units/hr Intravenous Titrated 1,900 Units/hr (04/22/21 1210)     Current Facility-Administered Medications   Medication Dose Route    acetaminophen  650 mg Oral    Or    acetaminophen  650 mg per NG tube    Or    acetaminophen  650 mg Rectal    dextrose  125 mL Intravenous    electrolyte replacement protocol-NURSE to INITIATE   Combination    glucagon (rDNA)  1 mg Intramuscular    heparin (porcine)  4,000-9,000 Units Intravenous    insulin lispro (1 Unit Dial)  1-7 Units Subcutaneous    naloxone  0.4 mg Intravenous            LABS:    Recent CBC:  Recent Labs   Lab 04/22/21  0406 04/21/21  1134   WBC 10.0 10.8   RBC 4.39 4.94   Hemoglobin 13.9 16.0   Hematocrit  42.9 48.0   MCV 98 97   PLT CT 180 193       Recent BMP:  Recent Labs   Lab 04/22/21  0406 04/21/21  1141 04/21/21  1134   Sodium 137  --  139   Potassium 3.8  --  4.3   Chloride 103  --  102   CO2 23  --  27.4   BUN 13  --  15   Creatinine 0.75*  --  0.92   Creatinine I-Stat  --  0.90  --    Glucose 196*  --  192*   EGFR 105 100 97   Calcium 8.6  --  9.3       Other Labs (if any):  LFTs     -   Recent Labs   Lab 04/22/21  0406   Magnesium 2.0   Phosphorus 3.6        Cardiac -   Recent Labs   Lab 04/21/21  1727 04/21/21  1504   Troponin I 0.05* 0.04*     PT/PTT -   Recent Labs   Lab 04/21/21  1134   PT INR 1.0          IMAGING STUDIES:  CT Angiogram Chest (PE)    Result Date: 04/21/2021  1. Large saddle embolus in the main dome area arteries and multiple emboli in the branch vessels. 2. Right heart strain. 3. Hepatic steatosis 4. No focal infiltrates or pleural effusions 5. Bilateral adrenal gland enlargement left greater than right question underlying hyperplasia, carcinoma or adenomas further investigation suggested. Results phoned to ER physician. ReadingStation:[email or web address]    US Venous Low Extrem Duplx Dopp Comp Bilat    Result Date: 04/21/2021  Extensive right-sided DVT. Negative left leg without DVT seen. -RN Rodman Pickle notified at 6:10 PM by the  sonographer. ReadingStation:[email or web address]    Venous Thrombectomy    Result Date: 04/22/2021  1. Successful catheter directed thromboembolectomy for massive pulmonary embolus. 2. Patient's pulmonary arterial pressure significantly decreased post procedure. 3. No apparent complications. ReadingStation:[email or web address]        Physical Exam:  Vitals:  T:98.1 F (36.7 C) (Oral)   BP:131/74, HR:87, RR:18, SaO2:95%  General: Alert. No Acute Distress.  HEENT:  Normocephalic Atraumatic. Mucus Membranes Moist, EOMI, PERRL  Cardiovascular: RRR. Normal Heart Sounds. Brisk cap refill  Respiratory: CTAB. No rales, rhonchi or retractions.  Abdomen/GI: Nontender, nondistended. Normoactive BS  Integument: Skin intact, no bruises.  Extremities: Expected degrees of motion in all 4 extremities. No Edema.  Neurologic: No focal neurologic abnormalities or gross focal deficit  Psychiatric: AAOx3, cooperative.       ASSESSMENT AND PLAN:  # Acute saddle embolus with multiple peripheral emboli  # Acute right leg DVT  # Hypercoagulable state due to VTE  -Troponin 0.05, BNP 84, HR 110, RR 26, D-dimer 6.23  -CTA of chest:  Large saddle embolus in the main dome area arteries and multiple emboli in the branch vessels.  Right heart strain.  Hepatic steatosis  -Heparin drip initiated  -Appreciate recommendation and guidance from intensivist  Now s/p ICU admission w/  IR thrombectomy. Transferring back to meidcal floor.     Elevated troponin in setting of acute saddle embolus  -Troponin 0.05. Now s/p thrombectomy. No symptoms and comfortable.  previous EKG to compare.  PR 152 and QTc 408     Tobacco dependence  -At least 40 pack per year smoking history  -Counseled on smoking cessation and nicotine patch order  Hyperglycemia without DM  -Glucose 192, A1C added  -Accucheck with sliding scale insulin coverage     Incidental adrenal gland enlargement on imaging  -CT angio of the chest:  Bilateral adrenal gland enlargement left greater than right    -Recommend outpatient follow-up for further evaluation with PCP        Disposition:  Wean heparin gtt to PO tomorrow and assess w/ CM/SW for options.  DVTPPx:  Heparin gtt  Code:  Full Code    Signed by:  Jonn Shingles, MD  Please contact my rounding number for any questions.

## 2021-04-22 NOTE — Plan of Care (Signed)
Problem: Moderate/High Fall Risk Score >5  Goal: Patient will remain free of falls  Outcome: Progressing

## 2021-04-22 NOTE — Nursing Progress Note (Addendum)
NURSE NOTE SUMMARY  [facility] - SURG TELEMETRY STEP-DOWN   Patient Name: Gary Vazquez,[name]   Attending Physician: Jonn Shingles, MD   Today's date:   04/22/2021 LOS: 1 days   Shift Summary:                                                              1951: Assumed pt care, assessment completed, tele box intact, call bell and urinal within reach, no needs at this time. Pt right groin site clean, dry, intact, soft and tender, no presence of hematoma. Pt complaining of pain 8/10 in right leg, tele med consulted. Pt complaining of right shoulder pain "it feels like it is bruised", pt has swelling in right and left wrist, looks like knots. Pt denies any pain in wrists with swelling. Will continue to monitor.    2104: Tele med ordered a once time dose of Toradol. Toradol given will continue to monitor.     0235: APTT resulted 50.4. Gtt same rate next APTT due at 1435.    Provider Notifications:        Rapid Response Notifications:  Mobility:      PMP Activity: Step 6 - Walks in Room (04/22/2021  5:41 PM)     Weight tracking:  Family Dynamic:   Last 3 Weights for the past 72 hrs (Last 3 readings):   Weight   04/22/21 0500 106.9 kg (235 lb 10.8 oz)   04/21/21 1623 108.6 kg (239 lb 6.7 oz)   04/21/21 1045 111.1 kg (245 lb)             Last Bowel Movement   Last BM Date: 04/21/21 (per pt)

## 2021-04-22 NOTE — Progress Notes (Signed)
[facility]  Progress Note    Patient Name: Gary Vazquez,[name]    Attending Physician: Jonn Shingles, MD                                             LOS:  1 DAYS   Primary Care Physician: Pcp, None, MD        ROOM#: 3520/3520-A                                      Assessment     55y male with pmhx of HTN, substance abuse and current everyday smoker presented to [facility] with R lower calf pain.  Found to have large saddle embolus, and R lower leg DVT.  taken to IR for pulmonary mechanical thrombectomy       Interval Events / ICU Course    9/24:  Admitted to [facility] with saddle PE, R lower leg DVT.  Taken to IR for mechanical thrombectomy   9/25:  No acute events overnight       Assessment & Plan    Patient Active Hospital Problem List:  Acute saddle pulmonary embolus (04/21/2021).  CT chest: Large saddle embolus in the main dome area arteries and multiple emboli in the branch vessels. Right heart strain.  Trop 0.05, BNP 84.3.  S/P mechanical thrombectomy with IR 9/24  -Continue heparin gtt  -Obtain Echocardiogram   -Recommend OP follow up for routine CA screenings    DVT,  BLE [place]:  Right leg: Occlusive DVT involving the mid femoral vein through the posterior tibial and peroneal veins. Remainder deep venous portions patent  -Continue heparin gtt  -Transition to PO anticoagulation when able      HTN, not currently on meds as OP  -Continue to monitor BP     Current everyday tobacco use, 1pk/day  -Nicotine patch  -Smoking cessation strongly encouraged      Bilateral adrenal gland enlargement, per CT scan   -Recommend OP follow up      Elevated BG, hx of pre-diabetes  -Continue ISS, monitor FS AC/HS  -Check A1C    Substance abuse.  Patient admitted to chronic marijuana use and suboxone use   -Monitor for s/sx of withdrawal   -Cessation strongly encouraged     General ICU Assessment / Plans - if applicable    Vascular access: Peripherals.  GI Prophylaxis: None needed.  Nutrition: Cardiac diet.  Sedation: None  needed.  Foley Catheter: not needed  VTE Prophylaxis: VTE protocol Heparin infusion     Other:  Code Status: Full Code      HISTORY / Subjective    Feels less anxious today than last night.  Still having some pain to right leg      PHYSICAL EXAM   Vitals: BP 148/79    Pulse 91    Temp 98.1 F (36.7 C) (Oral)    Resp 17    Ht 1.88 m (6\' 2" )    Wt 106.9 kg (235 lb 10.8 oz)    SpO2 95%    BMI 30.26 kg/m    Admission Weight: Weight: 111.1 kg (245 lb)  Last Weight:   Wt Readings from Last 1 Encounters:   04/22/21 106.9 kg (235 lb 10.8 oz)         Body  mass index is 30.26 kg/m.  I/O:   Intake/Output Summary (Last 24 hours) at 04/22/2021 1129  Last data filed at 04/21/2021 2338  Gross per 24 hour   Intake --   Output 450 ml   Net -450 ml     Vent settings:      Review of Systems   Constitutional:  Negative for malaise/fatigue.   HENT: Negative.     Eyes: Negative.    Respiratory:  Negative for shortness of breath.    Cardiovascular:  Positive for leg swelling. Negative for chest pain and palpitations.   Gastrointestinal:  Negative for abdominal pain, constipation, diarrhea, nausea and vomiting.   Genitourinary:  Negative for dysuria, frequency and urgency.   Musculoskeletal:  Negative for joint pain and myalgias.        Right leg pain    Skin: Negative.    Neurological:  Negative for dizziness, tingling, focal weakness, weakness and headaches.   Endo/Heme/Allergies:  Does not bruise/bleed easily.   Psychiatric/Behavioral:  Negative for substance abuse. The patient is nervous/anxious.      Physical Exam  Vitals and nursing note reviewed.   Constitutional:       General: He is not in acute distress.     Appearance: Normal appearance. He is normal weight. He is not ill-appearing, toxic-appearing or diaphoretic.   HENT:      Head: Normocephalic and atraumatic.      Right Ear: External ear normal.      Left Ear: External ear normal.      Nose: Nose normal. No congestion or rhinorrhea.      Mouth/Throat:      Mouth: Mucous  membranes are dry.      Pharynx: Oropharynx is clear. No oropharyngeal exudate or posterior oropharyngeal erythema.   Eyes:      General: No scleral icterus.        Right eye: No discharge.         Left eye: No discharge.      Extraocular Movements: Extraocular movements intact.      Conjunctiva/sclera: Conjunctivae normal.      Pupils: Pupils are equal, round, and reactive to light.   Cardiovascular:      Rate and Rhythm: Normal rate and regular rhythm.      Pulses: Normal pulses.           Dorsalis pedis pulses are 2+ on the right side and 2+ on the left side.        Posterior tibial pulses are 2+ on the right side and 2+ on the left side.      Heart sounds: Normal heart sounds. No murmur heard.    No friction rub. No gallop.      Comments: Right groin femoral access site with dressing in place, CDI.  Surrounding site is soft, no signs of bruising.  Some tenderness to palpation   Pulmonary:      Effort: Pulmonary effort is normal. No respiratory distress.      Breath sounds: Normal breath sounds. No stridor. No wheezing or rales.   Abdominal:      General: Abdomen is flat. Bowel sounds are normal. There is no distension.      Palpations: Abdomen is soft.      Tenderness: There is no abdominal tenderness. There is no guarding.   Musculoskeletal:         General: No swelling or tenderness. Normal range of motion.      Cervical back: Normal range of  motion and neck supple. No rigidity. No muscular tenderness.      Right lower leg: 1+ Edema present.      Left lower leg: No edema.   Lymphadenopathy:      Cervical: No cervical adenopathy.   Skin:     General: Skin is warm and dry.      Capillary Refill: Capillary refill takes less than 2 seconds.      Coloration: Skin is not pale.   Neurological:      General: No focal deficit present.      Mental Status: He is alert and oriented to person, place, and time. Mental status is at baseline.      Cranial Nerves: No cranial nerve deficit.      Sensory: No sensory deficit.    Psychiatric:         Mood and Affect: Mood normal.         Behavior: Behavior normal.         Thought Content: Thought content normal.          Tubes/Lines/Airway             Patient Lines/Drains/Airways Status       Active PICC Line / CVC Line / PIV Line / Drain / Airway / Intraosseous Line / Epidural Line / ART Line / Line / Wound / Pressure Ulcer / NG/OG Tube       Name Placement date Placement time Site Days    Peripheral IV 04/21/21 20 G Right Antecubital 04/21/21  1136  Antecubital  less than 1    Peripheral IV 04/21/21 20 G Distal;Posterior;Right Forearm 04/21/21  1704  Forearm  less than 1    Puncture Site 04/21/21 Femoral Right;Anterior Groin 04/21/21  2000  -- less than 1                        Labs Reviewed:  Estimated Creatinine Clearance: 141.6 mL/min (A) (based on SCr of 0.75 mg/dL (L)).  CBC:   Recent Labs   Lab 04/22/21  0406   WBC 10.0   Hemoglobin 13.9   Hematocrit 42.9   PLT CT 180         Coags:   Recent Labs   Lab 04/22/21  0406 04/21/21  2109 04/21/21  1134   PT  --   --  10.9   PT INR  --   --  1.0   aPTT 41.1*  More results in Results Review 27.0   More results in Results Review = values in this interval not displayed.         ABGs:  No results found for: ABGCOLLECTIO, ALLENSTEST, PHART, PCO2ART, PO2ART, HCO3ART, BEART, O2SATART    TCO2 I-Stat   Date Value Ref Range Status   04/21/2021 27 24 - 29 mMol/L Final   07/18/2013 22 (L) 24 - 29 mMol/L Final    Chemistry:   Recent Labs     04/22/21  0406   Sodium 137   Potassium 3.8   Chloride 103   CO2 23   BUN 13   Creatinine 0.75*   EGFR 105   Glucose 196*   Calcium 8.6   Magnesium 2.0   Phosphorus 3.6       LFTs:   Recent Labs   Lab 04/22/21  0406   Glucose 196*                Other:    RADIOLOGY / IMAGING  Imaging personally reviewed by me, including: Chest CT, Bilateral lower extremity US     Nutrition assessment done in collaboration with Registered Dietitians:       ATTESTATION & BILLING      Patient's condition and plan discussed with: Dr.  Berton Bon, patient, bedside nurse, and hospitalist    Billing:  Level 3 Subsequent [phone]    I managed/supervised life or organ supporting interventions that required frequent physician assessments. I devoted my full attention in the ICU to the direct care of this patient for this period of time while critically ill.    Any critical care time was performed today and is exclusive of teaching, billable procedures, and not overlapping with any other providers.    Signed by: Chalmers Cater, NP   cc:Pcp, None, MD

## 2021-04-22 NOTE — Nursing Progress Note (Addendum)
NURSE NOTE SUMMARY  [facility] - SURG TELEMETRY STEP-DOWN   Patient Name: [name],[name]   Attending Physician: Jonn Shingles, MD   Today's date:   04/22/2021 LOS: 1 days   Shift Summary:                                                              1530 Pt to  room 336. SR on Tele.    1600 assessment complete. Heparin gtt running per order into R piv without difficulty.  R groin site clean, dry, intact, soft, tender, no presence of hematoma. Pt reporting 8/10 R calf pain. PRN Tylenol pain mediation administered. Pt denies need for stronger prn pain medication.Pt has call bell/table and urinal in reach. Encouraged to call; whiteboard updated. Will monitor.      1935 Pt reporting 7/10 R calf pain even after prn Tylenol given. Telemed notified of the same. Night shift RN's name and number given on Telemed message.     Provider Notifications:        Rapid Response Notifications:  Mobility:      PMP Activity: Step 6 - Walks in Room (04/22/2021  5:41 PM)     Weight tracking:  Family Dynamic:   Last 3 Weights for the past 72 hrs (Last 3 readings):   Weight   04/22/21 0500 106.9 kg (235 lb 10.8 oz)   04/21/21 1623 108.6 kg (239 lb 6.7 oz)   04/21/21 1045 111.1 kg (245 lb)             Last Bowel Movement   Last BM Date: 04/21/21 (per pt)

## 2021-04-22 NOTE — Progress Notes (Signed)
NURSE NOTE SUMMARY  [facility] - CRITICAL CARE 2   Patient Name: Gary Vazquez,[name]   Attending Physician: Jonn Shingles, MD   Today's date:   04/22/2021 LOS: 1 days   Shift Summary:                                                                1500: Report called to Julietta, RN.     1530: patient transferred to room 336. CNA in room.      Provider Notifications:        Rapid Response Notifications:  Mobility:      PMP Activity: Step 3 - Bed Mobility (04/22/2021 12:00 AM)     Weight tracking:  Family Dynamic:   Last 3 Weights for the past 72 hrs (Last 3 readings):   Weight   04/22/21 0500 106.9 kg (235 lb 10.8 oz)   04/21/21 1623 108.6 kg (239 lb 6.7 oz)   04/21/21 1045 111.1 kg (245 lb)             Last Bowel Movement   Last BM Date: 04/21/21

## 2021-04-23 ENCOUNTER — Inpatient Hospital Stay: Payer: Medicaid Other

## 2021-04-23 LAB — VH APTT( HEPARIN INFUSION THERAPY): aPTT: 50.4 s — ABNORMAL HIGH (ref 24.0–34.0)

## 2021-04-23 LAB — VH DEXTROSE STICK GLUCOSE
Glucose POCT: 147 mg/dL — ABNORMAL HIGH (ref 71–99)
Glucose POCT: 148 mg/dL — ABNORMAL HIGH (ref 71–99)
Glucose POCT: 117 mg/dL — ABNORMAL HIGH (ref 71–99)
Glucose POCT: 99 mg/dL (ref 71–99)
Glucose POCT: 143 mg/dL — ABNORMAL HIGH (ref 71–99)

## 2021-04-23 LAB — CBC AND DIFFERENTIAL
Basophils %: 1.2 % (ref 0.0–3.0)
Basophils Absolute: 0.1 10*3/uL (ref 0.0–0.3)
Eosinophils %: 4.1 % (ref 0.0–7.0)
Eosinophils Absolute: 0.3 10*3/uL (ref 0.0–0.8)
Hematocrit: 42.6 % (ref 39.0–52.5)
Hemoglobin: 14.1 gm/dL (ref 13.0–17.5)
Lymphocytes Absolute: 2 10*3/uL (ref 0.6–5.1)
Lymphocytes: 24.5 % (ref 15.0–46.0)
MCH: 33 pg (ref 28–35)
MCHC: 33 gm/dL (ref 32–36)
MCV: 98 fL (ref 80–100)
MPV: 7.7 fL (ref 6.0–10.0)
Monocytes Absolute: 0.8 10*3/uL (ref 0.1–1.7)
Monocytes: 10.2 % (ref 3.0–15.0)
Neutrophils %: 60.1 % (ref 42.0–78.0)
Neutrophils Absolute: 4.8 10*3/uL (ref 1.7–8.6)
PLT CT: 196 10*3/uL (ref 130–440)
RBC: 4.35 10*6/uL (ref 4.00–5.70)
RDW: 12 % (ref 11.0–14.0)
WBC: 8.1 10*3/uL (ref 4.0–11.0)

## 2021-04-23 LAB — BASIC METABOLIC PANEL
Anion Gap: 14 mMol/L (ref 7.0–18.0)
BUN / Creatinine Ratio: 20.9 Ratio (ref 10.0–30.0)
BUN: 14 mg/dL (ref 7–22)
CO2: 27 mMol/L (ref 20–30)
Calcium: 8.7 mg/dL (ref 8.5–10.5)
Chloride: 102 mMol/L (ref 98–110)
Creatinine: 0.67 mg/dL — ABNORMAL LOW (ref 0.80–1.30)
EGFR: 109 mL/min/{1.73_m2} (ref 60–150)
Glucose: 135 mg/dL — ABNORMAL HIGH (ref 71–99)
Osmolality Calculated: 280 mOsm/kg (ref 275–300)
Potassium: 4 mMol/L (ref 3.5–5.3)
Sodium: 139 mMol/L (ref 136–147)

## 2021-04-23 MED ORDER — APIXABAN 5 MG PO TABS
10.0000 mg | ORAL_TABLET | Freq: Two times a day (BID) | ORAL | Status: DC
Start: 2021-04-23 — End: 2021-04-23
  Administered 2021-04-23: 10 mg via ORAL
  Filled 2021-04-23: qty 2

## 2021-04-23 MED ORDER — APIXABAN 5 MG PO TABS
5.0000 mg | ORAL_TABLET | Freq: Two times a day (BID) | ORAL | Status: DC
Start: 2021-04-30 — End: 2021-04-24

## 2021-04-23 MED ORDER — LORAZEPAM 1 MG PO TABS
1.0000 mg | ORAL_TABLET | Freq: Once | ORAL | Status: AC
Start: 2021-04-23 — End: 2021-04-23
  Administered 2021-04-23: 1 mg via ORAL
  Filled 2021-04-23: qty 1

## 2021-04-23 MED ORDER — APIXABAN 5 MG PO TABS
10.0000 mg | ORAL_TABLET | Freq: Two times a day (BID) | ORAL | Status: DC
Start: 2021-04-23 — End: 2021-04-24
  Administered 2021-04-23 – 2021-04-24 (×2): 10 mg via ORAL
  Filled 2021-04-23 (×3): qty 2

## 2021-04-23 MED ORDER — APIXABAN 5 MG PO TABS
5.0000 mg | ORAL_TABLET | Freq: Two times a day (BID) | ORAL | Status: DC
Start: 2021-04-30 — End: 2021-04-23

## 2021-04-23 NOTE — Plan of Care (Signed)
Problem: Moderate/High Fall Risk Score >5  Goal: Patient will remain free of falls  Outcome: Progressing

## 2021-04-23 NOTE — Consults (Cosign Needed)
Consult for PE.  Thrombectomy performed by Dr. Anselm Jungling.  Please see his note dated 9/24.    VSS, GSS, Pt started on OAC by Dr. Georgianne Fick.  Overall pt doing well.  NO further need from IR.

## 2021-04-23 NOTE — Nursing Progress Note (Addendum)
NURSE NOTE SUMMARY  [facility] - SURG TELEMETRY STEP-DOWN   Patient Name: Gary Vazquez,[name]   Attending Physician: Jonn Shingles, MD   Today's date:   04/23/2021 LOS: 2 days   Shift Summary:                                                              0272- Report received and care assumed of pt. Pt sitting up in bed eating breakfast. Offers no complaints at this time, no distress noted. CBIR, will continue to monitor.     1800- Pt anxious, worried about paying for medication. Spoke with Dr Georgianne Fick. Telephone order for one time dose ativan.     Provider Notifications:        Rapid Response Notifications:  Mobility:      PMP Activity: Step 3 - Bed Mobility (04/22/2021  8:00 PM)     Weight tracking:  Family Dynamic:   Last 3 Weights for the past 72 hrs (Last 3 readings):   Weight   04/23/21 0314 107.4 kg (236 lb 12.4 oz)   04/22/21 0500 106.9 kg (235 lb 10.8 oz)   04/21/21 1623 108.6 kg (239 lb 6.7 oz)             Last Bowel Movement   Last BM Date: 04/22/21

## 2021-04-23 NOTE — Progress Notes (Signed)
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 MSW will f/u with the patient in the AM for outcome of prescription assistance.         Readmission Risk  [facility] - SURG TELEMETRY STEP-DOWN   Patient Name: Gary Vazquez,[name]   Attending Physician: Jonn Shingles, MD   Today's date:   04/23/2021 LOS: 2 days   Expected Discharge Date      Readmission Assessment:                                                              Discharge Planning  ReAdmit Risk Score: 8.61  Does the patient have perscription coverage?: No  CM Comments: 9/26 MSW (YD): Presented to care d/t R. LE swelling. MD will [place] the pt on Xaletto vs Eliqusis. The patient is uninsured. Referral to [facility] to assess for Medicaid. The patient was given contact information for eliquis prescription coverage assistance. The patient will need a 30-free coupon and [place] medication assistance. The patient will need a referral to the transition clinic. Indep PTA. Has transport home. MSW will f/u with the patient in the AM.       IDPA:   Patient Type  Within 30 Days of Previous Admission?: No  Healthcare Decisions  Interviewed:: Patient  Orientation/Decision Making Abilities of Patient: Alert and Oriented x3, able to make decisions  Advance Directive: Patient does not have advance directive  Healthcare Agent Appointed: No  Prior to admission  Prior level of function: Independent with ADLs, Ambulates independently  Type of Residence: Private residence  Home Layout: One level  Have running water, electricity, heat, etc?: Yes  Living Arrangements: Alone  How do you get to your MD appointments?: doesnt have a MD  How do you get your groceries?: self  Who fixes your meals?: self  Who does your laundry?: self  Who picks up your prescriptions?: self  Dressing: Independent  Grooming: Independent  Feeding: Independent  Bathing: Independent  Toileting: Independent  Name of Prior Assisted Living Facility: n/a  Home Care/Community Services:  None  Prior SNF admission? (Detail): n/a  Prior Rehab admission? (Detail): n/a  Discharge Planning  Support Systems: Children  Patient expects to be discharged to:: home  Anticipated [place] plan discussed with:: Same as interviewed  Mode of transportation:: Private car (family member)  Does the patient have perscription coverage?: No  Consults/Providers  PT Evaluation Needed: No  OT Evalulation Needed: No  SLP Evaluation Needed: No  Correct PCP listed in Epic?: No (comment)  Family and PCP  PCP on file was verified as the current PCP?: Patient/family states they do not have a PCP   30 Day Readmission:       Provider Notifications:           Suan Halter, MSW  Social Worker  Available on [employer]  [phone]

## 2021-04-23 NOTE — UM Notes (Signed)
VH Utilization Management Review Sheet    Facility :  [facility]    NAME: Gary Vazquez.  MR#: [id number]    CSN#: [id number]    ROOM: 336/336-A AGE: 55 y.o.    Date of Birth: 18-Feb-1966    ADMIT DATE AND TIME: 04/21/2021 10:49 AM      PATIENT CLASS: INPT    ATTENDING PHYSICIAN: Jonn Shingles, MD  PAYOR:Payor: /       AUTH #:     DIAGNOSIS:     ICD-10-CM    1. Multiple subsegmental pulmonary emboli without acute cor pulmonale  I26.94       2. Primary hypertension  I10 Ambulatory referral to [facility]      3. Acute saddle pulmonary embolism, unspecified whether acute cor pulmonale present  I26.92 Ambulatory referral to [facility]      4. Pre-diabetes  R73.03 Ambulatory referral to [facility]          HISTORY:   Past Medical History:   Diagnosis Date    Abscess     Arthritis     HTN (hypertension)     Hyperlipidemia        DATE OF REVIEW: 04/23/2021  MCG  History of present illness: 9.24.2022 @ 1122 ED  55 YO MALE PRESENTS RLE SWELLING AND INCREASED EXERTIONAL DYSPNEA W RECENT CHEST PAIN.  IMAGING: LARGE PE W/O OVERT RIGHT HEART STRAIN, DOES HAVE BILATERAL STRADDLE EMBOLI AND SUBSEGMENTAL PER MD NOTES   ROS: SOB, LEG SWELLING    Vitals upon admission:  1543/98 112 22  98.72F  98%  Treatment in emergency department:    Physical exam:  TACHYCARDIA, OBESE, EDEMA RLE.   Abnormal labs:  D DIMER 63.23  BNP  84.3  Imaging:    Assessment/Plan:  9.24.2022 @ 1439 INTERNAL MEDICINE  ACUTE SADDLE EMBOLUS W MULTIPLE PERIPHERAL EMBOLI  ACUTE RIGHT LEG DVT  HYPERCOAGULABLE STATE DUE TO VTE  ELEVATED TROPONIN IN SETTING OF ACUTE SADDLE EMBOLUS  TOBACCO DEPENDENCE  HYPERGLYCEMIA W/O DM  INCIDENTAL ADRENAL GLAND ENLARGEMENT ON IMAGING    PLAN  ADMIT TO MEDICAL FLOOR  HEPARIN DRIP STARTED  PER IR NPO FOR NOW  POSSIBLE THROMBECTOMY VS GUIDED TPA TREATMENT  VENOUS DUPLEX STUDY BILATERAL LEGS  ACCUC KECK W SSI COVERAGE        9.24.2022 CRITICAL CARE  MEDICINE      MEDICATIONS  CONTINUOUS  HEPARIN IV      9.24.2022 @ 1802 RADIOLOGY   THROMBECTOMY NOW  DUE TO RIGHT HEART STRAIN          9.25.2022 CRITICAL CARE MEDICINE      LABS  CREAT  0.75  GLUC  196  APTT  41.1    9.25.2022 INTERNAL MEDICINE  PT TRANSFERRED TO MEDICAL FLOOR                VITALS: BP (!) 157/92   Pulse 84   Temp 98.1 F (36.7 C) (Temporal)   Resp 18   Ht 1.88 m (6\' 2" )   Wt 107.4 kg (236 lb 12.4 oz)   SpO2 95%   BMI 30.40 kg/m     Active Hospital Problems    Diagnosis    Acute saddle pulmonary embolus    Hypertension    Tobacco dependence     T. Donley Redder BSN, RN  Utilization Management Nurse  VHS Utilization Management  [facility] Integrated Care Coordination  [facility]  [address]  [place]  [phone] phone  [phone] fax  Email: [email or web address]

## 2021-04-23 NOTE — Consults (Signed)
Referral to [place] to assess the patient for Medicaid.      Suan Halter, MSW  Social Worker  Available on [employer]  [phone]

## 2021-04-23 NOTE — Progress Notes (Signed)
Date Time:  04/23/21 11:51 AM Attending: Jonn Shingles, MD   Pt. Name:  Gary Vazquez,[name]     PCP: Pcp, None, MD          INTERIM SUMMARY:  Pt. is a 55/M w/ PMhx of HTN, Substance abuse, continuos smoker. Presented to care d/t R. LE swelling w/ iscomfort. Evauated and then admitted to ICU when saddle-embolus noted on imaging. Underwent Thrombectomy w/ IR and transferred to medical floor.   Transitioned from heparin gtt to PO Eliquis w/ CM consulte to help w/ medicaid coverage  and AC coverage.          SUBJECTIVE:  Pt. much more comfortable and doing well on heparin gtt s/p thrombectomy.  Monitoring on PO eliquis         Objective :  Vitals:    04/23/21 0700 04/23/21 0805 04/23/21 0900 04/23/21 1131   BP:  138/88  (!) 157/92   Pulse: 67 75 75 90   Resp:  18  18   Temp:  97.9 F (36.6 C)  98.1 F (36.7 C)   TempSrc:  Temporal  Temporal   SpO2:  95%  95%   Weight:       Height:           Intake/Output Summary (Last 24 hours) at 04/23/2021 1151  Last data filed at 04/23/2021 1055  Gross per 24 hour   Intake --   Output 975 ml   Net -975 ml       Weight Monitoring 04/21/2021 04/22/2021 04/23/2021   Height 188 cm - -   Height Method Stated - -   Weight 108.6 kg 106.9 kg 107.4 kg   Weight Method Bed Scale Bed Scale Standing Scale   BMI (calculated) 30.8 kg/m2 - -       Body mass index is 30.4 kg/m.        MEDS: (SCHEDULED/INFUSIONS/PRN)  Current Facility-Administered Medications   Medication Dose Route Frequency    apixaban  10 mg Oral Q12H SCH    Followed by    Melene Muller ON 04/30/2021] apixaban  5 mg Oral Q12H SCH    insulin lispro (1 Unit Dial)  1-9 Units Subcutaneous TID AC    And    insulin lispro (1 Unit Dial)  1-7 Units Subcutaneous QHS    nicotine  1 patch Transdermal Daily    sodium chloride (PF)  3 mL Intravenous Q12H SCH    sodium chloride (PF)  3 mL Intravenous Q12H SCH    sodium chloride 0.9 % flush  5-10 mL Intracatheter Q8H     Current  Facility-Administered Medications   Medication Dose Route Frequency Last Rate     Current Facility-Administered Medications   Medication Dose Route    acetaminophen  650 mg Oral    Or    acetaminophen  650 mg per NG tube    Or    acetaminophen  650 mg Rectal    dextrose  125 mL Intravenous    glucagon (rDNA)  1 mg Intramuscular    insulin lispro (1 Unit Dial)  1-7 Units Subcutaneous    naloxone  0.4 mg Intravenous            LABS:    Recent CBC:  Recent Labs   Lab 04/23/21  0120 04/22/21  0406   WBC 8.1 10.0   RBC 4.35 4.39   Hemoglobin 14.1 13.9   Hematocrit 42.6 42.9   MCV 98 98   PLT CT 196  180         Recent BMP:  Recent Labs   Lab 04/23/21  0120 04/22/21  0406   Sodium 139 137   Potassium 4.0 3.8   Chloride 102 103   CO2 27 23   BUN 14 13   Creatinine 0.67* 0.75*   Glucose 135* 196*   EGFR 109 105   Calcium 8.7 8.6         Other Labs (if any):  LFTs     -   Recent Labs   Lab 04/22/21  0406   Magnesium 2.0   Phosphorus 3.6          Cardiac -   Recent Labs   Lab 04/21/21  1727 04/21/21  1504   Troponin I 0.05* 0.04*       PT/PTT -   Recent Labs   Lab 04/21/21  1134   PT INR 1.0            IMAGING STUDIES:  CT Angiogram Chest (PE)    Result Date: 04/21/2021  1. Large saddle embolus in the main dome area arteries and multiple emboli in the branch vessels. 2. Right heart strain. 3. Hepatic steatosis 4. No focal infiltrates or pleural effusions 5. Bilateral adrenal gland enlargement left greater than right question underlying hyperplasia, carcinoma or adenomas further investigation suggested. Results phoned to ER physician. ReadingStation:[email or web address]    US Venous Low Extrem Duplx Dopp Comp Bilat    Result Date: 04/21/2021  Extensive right-sided DVT. Negative left leg without DVT seen. -RN Rodman Pickle notified at 6:10 PM by the sonographer. 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 Copious amount of clot was removed. Aspirated blood was then filter through the cell saver device and returned to the patient. Mechanical thrombectomy of the left lower lobe pulmonary artery: 24 French aspiration catheter was then repositioned into the left main pulmonary artery. Bentson wire was inserted followed by the 5 [place] Berenstein catheter. Tip of wire was advanced into the left lower lobe pulmonary artery followed by the catheter. Wire was exchanged with a stiff wire and the aspiration catheter was advanced into the left lower lobe pulmonary artery. ReadingStation:[email or web address]    Result Date: 04/23/2021  1. Successful catheter directed thromboembolectomy for massive pulmonary embolus. 2. Patient's pulmonary arterial pressure significantly decreased post procedure. 3. No apparent complications. ReadingStation:[email or web address]        Physical Exam:  Vitals:  T:98.1 F (36.7 C) (Temporal)   BP:(!) 157/92, HR:90, RR:18, SaO2:95%  General: Alert. No Acute Distress.  HEENT:  Normocephalic Atraumatic. Mucus Membranes Moist, EOMI, PERRL  Cardiovascular: RRR. Normal Heart Sounds. Brisk cap refill  Respiratory: CTAB. No rales, rhonchi or retractions.  Abdomen/GI: Nontender, nondistended. Normoactive BS  Integument: Skin intact, no bruises.  Extremities: Expected degrees of motion in all 4 extremities. No Edema.  Neurologic: No focal neurologic abnormalities or gross focal deficit  Psychiatric: AAOx3, cooperative.       ASSESSMENT AND PLAN:  # Acute saddle embolus with multiple peripheral emboli  # Acute right leg DVT  # Hypercoagulable state due to VTE  -CTA of chest:  Large saddle embolus in the main dome area arteries and multiple emboli in the branch vessels.  Right heart strain.  Hepatic steatosis  -Heparin drip initiated  -Appreciate recommendation and guidance from intensivist  Now s/p ICU admission w/  IR thrombectomy. Transferring back to meidcal floor.  Transitioning heparin gtt to PO  eliquis and monitoring for stabiliy     Elevated troponin in setting of acute saddle embolus  -Troponin 0.05. Now s/p thrombectomy. No symptoms and comfortable.  previous EKG to compare.  PR 152 and QTc 408     Tobacco dependence  -At least 40 pack per year smoking history  -Counseled on smoking cessation and nicotine patch order     Hyperglycemia without DM  -Glucose 192, A1C added  -Accucheck with sliding scale insulin coverage     Incidental adrenal gland enlargement on imaging  -CT angio of the chest:  Bilateral adrenal gland enlargement left greater than right   -Recommend outpatient follow-up for further evaluation with PCP        Disposition:  Transitioning heparin to eliquis  DVTPPx:  Heparin gtt-> PO eliquis  Code:  Full Code    Signed by:  Jonn Shingles, MD  Please contact my rounding number for any questions.

## 2021-04-23 NOTE — Nursing Progress Note (Addendum)
NURSE NOTE SUMMARY  [facility] - SURG TELEMETRY STEP-DOWN   Patient Name: Gary Vazquez,[name]   Attending Physician: Jonn Shingles, MD   Today's date:   04/23/2021 LOS: 2 days   Shift Summary:                                                              1941: Resumed pt care, p resting comfortably in bed, pt anxious about financial responsibilities, pt denies wanting anything for anxiety, call bell within reach, urinal at bedside, will continue to monitor.    2120: Removed nicotine patch. Pt resting comfortably in bed. IV's flushed and capped.     0340: Pt complaining of back pain 3/10. PRN Tylenol administered, will continue to monitor.      Provider Notifications:        Rapid Response Notifications:  Mobility:      PMP Activity: Step 6 - Walks in Room (04/23/2021  2:55 PM)     Weight tracking:  Family Dynamic:   Last 3 Weights for the past 72 hrs (Last 3 readings):   Weight   04/23/21 0314 107.4 kg (236 lb 12.4 oz)   04/22/21 0500 106.9 kg (235 lb 10.8 oz)   04/21/21 1623 108.6 kg (239 lb 6.7 oz)             Last Bowel Movement   Last BM Date: 04/22/21

## 2021-04-24 LAB — BASIC METABOLIC PANEL
Anion Gap: 12.1 mMol/L (ref 7.0–18.0)
BUN / Creatinine Ratio: 17.3 Ratio (ref 10.0–30.0)
BUN: 13 mg/dL (ref 7–22)
CO2: 22 mMol/L (ref 20–30)
Calcium: 9 mg/dL (ref 8.5–10.5)
Chloride: 108 mMol/L (ref 98–110)
Creatinine: 0.75 mg/dL — ABNORMAL LOW (ref 0.80–1.30)
EGFR: 105 mL/min/{1.73_m2} (ref 60–150)
Glucose: 104 mg/dL — ABNORMAL HIGH (ref 71–99)
Osmolality Calculated: 276 mOsm/kg (ref 275–300)
Potassium: 4.1 mMol/L (ref 3.5–5.3)
Sodium: 138 mMol/L (ref 136–147)

## 2021-04-24 LAB — CBC AND DIFFERENTIAL
Basophils %: 0.6 % (ref 0.0–3.0)
Basophils Absolute: 0.1 10*3/uL (ref 0.0–0.3)
Eosinophils %: 3.9 % (ref 0.0–7.0)
Eosinophils Absolute: 0.4 10*3/uL (ref 0.0–0.8)
Hematocrit: 43.4 % (ref 39.0–52.5)
Hemoglobin: 14.5 gm/dL (ref 13.0–17.5)
Lymphocytes Absolute: 1.6 10*3/uL (ref 0.6–5.1)
Lymphocytes: 17.5 % (ref 15.0–46.0)
MCH: 32 pg (ref 28–35)
MCHC: 33 gm/dL (ref 32–36)
MCV: 97 fL (ref 80–100)
MPV: 7.8 fL (ref 6.0–10.0)
Monocytes Absolute: 0.9 10*3/uL (ref 0.1–1.7)
Monocytes: 9.7 % (ref 3.0–15.0)
Neutrophils %: 68.2 % (ref 42.0–78.0)
Neutrophils Absolute: 6.4 10*3/uL (ref 1.7–8.6)
PLT CT: 240 10*3/uL (ref 130–440)
RBC: 4.48 10*6/uL (ref 4.00–5.70)
RDW: 11.8 % (ref 11.0–14.0)
WBC: 9.3 10*3/uL (ref 4.0–11.0)

## 2021-04-24 LAB — VH DEXTROSE STICK GLUCOSE: Glucose POCT: 124 mg/dL — ABNORMAL HIGH (ref 71–99)

## 2021-04-24 MED ORDER — APIXABAN 5 MG PO TABS
ORAL_TABLET | ORAL | 0 refills | Status: AC
Start: 2021-04-24 — End: 2021-05-24

## 2021-04-24 NOTE — Plan of Care (Signed)
Problem: Moderate/High Fall Risk Score >5  Goal: Patient will remain free of falls  Outcome: Progressing     Problem: Compromised Hemodynamic Status  Goal: Vital signs and fluid balance maintained/improved  Description: Interventions:  1. Position patient for maximum circulation / cardiac output  2. Monitor and assess vitals and hemodynamic parameters with position changes  3. Monitor and compare daily weight  4. Monitor intake and output.  Notify LIP if urine output is less than 30 mL/hour   5. Monitor/assess lab values and report abnormal values  Outcome: Progressing     Problem: Inadequate Airway Clearance  Goal: Normal respiratory rate/effort achieved/maintained  Description: Interventions:  1. Plan activities to conserve energy: plan rest periods  Outcome: Progressing     Problem: Compromised Tissue integrity  Goal: Damaged tissue is healing and protected  Description: Interventions:  1. Monitor/assess Braden scale every shift  2. Provide wound care per wound care algorithm  3. Reposition patient every 2 hours and as needed unless able to reposition self  4. Increase activity as tolerated/progressive mobility  5. Relieve pressure to bony prominences for patients at moderate and high risk  6. Avoid shearing injuries   7. Keep intact skin clean and dry  8. Use bath wipes, not soap and water, for daily bathing   9. Use incontinence wipes for cleaning urine, stool and caustic drainage; Foley care as needed   10. Monitor external devices/tubes for correct placement to prevent pressure, friction and shearing   11. Encourage use of lotion/moisturizer on skin  12. Monitor patient's hygiene practices  13. Consult/collaborate with wound care nurse   14. Utilize specialty bed  15. Consider placing an indwelling catheter if incontinence interferes with healing of stage 3 or 4 pressure injury  Outcome: Progressing  Goal: Nutritional status is improving  Description: Interventions:  1. Assist patient with eating   2. Allow  adequate time for meals   3. Encourage patient to take dietary supplement(s) as ordered   4. Collaborate with Clinical Nutritionist  5. Include patient/patient care companion in decisions related to nutrition  Outcome: Progressing

## 2021-04-24 NOTE — Discharge Summary (Signed)
DISCHARGE SUMMARY - [facility]    Patient Name: Gary Vazquez,[name]  Attending Physician: Jonn Shingles, MD  Primary Care Physician: Marisa Sprinkles, MD    Date of Admission: 04/21/2021  Date of Discharge: 04/24/2021  Length of Stay in the Hospital: 3    Discharge Diagnoses:                                                                         Diagnoses  # Acute Saddle Embolus   w/ # Multiple Peripheral Emboli  # Hypercoagulable State d/t VTE  # Elevated Troponin   # Hyperglycemia w/o DM2  # Prediabetes (A1c 6.4)  # Incidental Adrenal Gland Enlargement      Discharge Medications:                                                                            Medication List        START taking these medications      apixaban 5 MG  Commonly known as: ELIQUIS  Take 2 tablets (10 mg total) by mouth every 12 (twelve) hours for 7 days, THEN 1 tablet (5 mg total) every 12 (twelve) hours for 23 days.  Start taking on: April 24, 2021               Where to Get Your Medications        These medications were sent to [facility] - [place], [place] - [address]  [address], [place] [address]      Phone: [phone]   apixaban 5 MG           Consultations:                                                                                         VH IP CONSULT TO INTERVENTIONAL RADIOLOGY  VH IP CONSULT TO SOCIAL WORK    Labs:                                                                                         Recent Labs   Lab 04/24/21  0241 04/23/21  0120 04/22/21  0406   WBC 9.3 8.1 10.0   RBC 4.48 4.35 4.39  Hemoglobin 14.5 14.1 13.9   Hematocrit 43.4 42.6 42.9   MCV 97 98 98   PLT CT 240 196 180       Recent Labs   Lab 04/24/21  0241 04/23/21  0120 04/22/21  0406 04/21/21  1141 04/21/21  1134   Sodium 138 139 137  --  139   Potassium 4.1 4.0 3.8  --  4.3   Chloride 108 102 103  --  102   CO2 22 27 23   --  27.4   BUN 13 14 13   --  15   Creatinine 0.75* 0.67* 0.75*  --  0.92    Creatinine I-Stat  --   --   --  0.90  --    Glucose 104* 135* 196*  --  192*   Calcium 9.0 8.7 8.6  --  9.3   Magnesium  --   --  2.0  --   --              Recent Labs   Lab 04/21/21  1134   PT INR 1.0       Nutrition(if any)       Imaging studies:                                                                                       CT Angiogram Chest (PE)    Result Date: 04/21/2021  1. Large saddle embolus in the main dome area arteries and multiple emboli in the branch vessels. 2. Right heart strain. 3. Hepatic steatosis 4. No focal infiltrates or pleural effusions 5. Bilateral adrenal gland enlargement left greater than right question underlying hyperplasia, carcinoma or adenomas further investigation suggested. Results phoned to ER physician. ReadingStation:[email or web address]    US Venous Low Extrem Duplx Dopp Comp Bilat    Result Date: 04/21/2021  Extensive right-sided DVT. Negative left leg without DVT seen. -RN Rodman Pickle notified at 6:10 PM by the sonographer. 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 Copious amount of clot was removed. Aspirated blood was then filter through the cell saver device and returned to the patient. Mechanical thrombectomy of the left lower lobe pulmonary artery: 24 French aspiration catheter was then repositioned into the left main pulmonary artery. Bentson wire was inserted followed by the 5 [place] Berenstein catheter. Tip of wire was advanced into the left lower lobe pulmonary artery followed by the catheter. Wire was exchanged with a stiff wire and the aspiration catheter was advanced into the left lower lobe pulmonary artery. ReadingStation:[email or web address]    Result Date: 04/23/2021  1. Successful catheter directed thromboembolectomy for massive pulmonary embolus. 2. Patient's pulmonary arterial pressure significantly decreased  post procedure. 3. No apparent complications. ReadingStation:[email or web address]     Culture results:  Microbiology Results (last 15 days)       Procedure Component Value Units Date/Time    Respiratory Specimen Xpert Xpress  SARS-CoV-2 Plus [[id number]] Collected: 04/21/21 1600    Order Status: Completed Specimen: Nasopharyngeal Swab Updated: 04/21/21 1703     SARS-CoV-2 RNA Negative     Comment: This test is performed using multiplexed Real-time RT-PCR on the Xpert Xpress CoV-2/Flu/RSV plus Assay (Cepheid).  The 2019 novel coronavirus (SARS-CoV-2) N2, E, RdRp target nucleic acids are not detected.  This test has been authorized by FDA under an Emergency Use Authorization (EUA) for use by authorized laboratories.  Negative results do not preclude SARS-CoV-2, influenza A virus, influenza B virus and/or RSV infection and should not be used as the sole basis for treatment or other patient management decisions.  Negative result must be combined with clinical observations, patient history, and/or epidemiological information.          Does patient have symptoms related to condition of interest? N     Is the patient hospitalized because of this condition? N     Is patient employed in a healthcare setting? N     Does patient reside in a congregate care setting? N     Is the patient pregnant? N     Comment: The above 6 analytes were performed by [facility] [id number])  [address],[place] [address]         Narrative:      Specimen source - Nasopharyngeal Swab       Lab Specimen No [[id number]]     Order Status: Canceled Specimen: Other             Hospital Course:                                                                                    For full details of the patient's admission please consult the whole medical record including daily progress notes, history and physical, consult notes, lab reports as well as  imaging studies.  Briefly:     Pt. is a 55/M w/ PMhx of HTN, Substance abuse, continuos smoker. Presented to care d/t R. LE swelling w/ iscomfort. Evauated and then admitted to ICU when saddle-embolus noted on imaging. Underwent Thrombectomy w/ IR and transferred to medical floor.   Transitioned from heparin gtt to PO Eliquis w/ CM consulte to help w/ medicaid coverage  and AC coverage. Toelrated Eliquis well and prepped for discahrge.    NOTE:  Post discharge issues include:  1.) Need to be set up with a PCP for management of chronic toabcco abuse, Hypercaogualble state s/p PE, Prediabetes.  2.) Pt. likely to require lifelong anticoagulation for this unrpovoked saddle embolus. Free period of eliquis is 84month; will need to address medicaid need in the interim.      Discharge Day Exam:  Vitals:  T:97.9 F (36.6 C) (Temporal)   BP:150/77, HR:74, RR:19, SaO2:95%  General: No Acute Distress. Obese  HEENT:  NCAT, MMM, EOMI, PERRL  Cardiovascular: RRR. No Murmurs. Brisk cap refill  Respiratory: CTAB. No rales, rhonchi or retractions  Gastrointestinal: nt, nd. Normoactive BS  Integument: Skin intact, no bruises.  Extremities: Expected degrees of motion in all 4 extremities. No Edema.  Neurologic: No focal neurologic abnormalities or gross focal deficit  Psychiatric: AAOx3, cooperative.           Weight Monitoring 11/18/2014 02/05/2016 04/21/2021 04/21/2021 04/22/2021 04/23/2021 04/24/2021   Height - 182.9 cm 188 cm 188 cm - - -   Height Method - Stated Stated Stated - - -   Weight 92.2 kg 94.3 kg 111.131 kg 108.6 kg 106.9 kg 107.4 kg 106 kg   Weight Method Actual Actual Actual Bed Scale Bed Scale Standing Scale Standing Scale   BMI (calculated) - 28.3 kg/m2 31.5 kg/m2 30.8 kg/m2 - - -           Discharge condition: stable    Discharge instructions:                                                                             Activity: As Tolerated    Discharge Instructions/ Follow up:  Pt. is to keep the appointments as listed  below.   Follow-up Information       [facility]. Go on 04/30/2021.    Specialty: Internal Medicine  Why: Patient to go for a follow up appointment on October 3 @ 11 am  Contact information:  [address]  [place] [address]  [phone]                           Time spent coordinating discharge and reviewing discharge plan: 40 minutes    Signed by:   Jonn Shingles, MD  04/24/2021  9:55 AM    [facility]  Office number [phone]    CC: Pcp, None, MD

## 2021-04-24 NOTE — Progress Notes (Addendum)
MSW spoke to [place] DP with VP and she advised that the patient eliquis was filled and she will deliver to the b/s when it is really. No other needs from social worker standpoint        Quick Doc  [facility] - SURG TELEMETRY STEP-DOWN   Patient Name: Gary Vazquez, Gary Vazquez.   Attending Physician: Jonn Shingles, MD   Today's date:   04/24/2021 LOS: 3 days   Expected Discharge Date      Quick  Assessment:                                                              ReAdmit Risk Score: 8.7    CM Comments: 9/27 MSW (YD): MSW followed- up this AM with patient regarding outcome of follow-up with prescription coverage assistance. Patient advised that he will get an application by Friday to complete and mail back to confirm fin status. The patient will be provided 30-day free Eliquis. He advised will be able to cover following month. Patient to be screened for Medicaid to assist with Medications. Patient has transport home. No other needs from Child psychotherapist standpoint.    Physical Discharge Disposition: Home                                   Mode of Transportation: Car     Patient/Family/POA notified of transfer plan: Yes  Patient agreeable to discharge plan/expected d/c date?: Yes  Family/POA agreeable to discharge plan/expected d/c date?: Yes  Bedside nurse notified of transport plan?: Yes                       Physical Discharge Disposition: Home       Provider Notifications:           Suan Halter, MSW  Social Worker  Available on [employer]  [phone]

## 2021-04-27 NOTE — Progress Notes (Deleted)
[facility]   [facility] [address] [facility]  [address]  [place] [address]  Dept: [phone]  Dept Fax: [phone]     Visit Date: 04/30/2021    CC:   1. Primary hypertension        2. Acute saddle pulmonary embolism without acute cor pulmonale        3. Hospital discharge follow-up        4. Cigarette nicotine dependence without complication        5. Obesity (BMI 30.0-34.9)             HISTORY OF PRESENT ILLNESS  Gary Vazquez. is 55 y.o. male who comes in to [facility] after recent hospitalization where patient was admitted on 04/21/21 and discharged on 04/24/21     # Acute Saddle Embolus   w/ # Multiple Peripheral Emboli  # Hypercoagulable State d/t VTE  # Elevated Troponin   # Hyperglycemia w/o DM2  # Prediabetes (A1c 6.4)  # Incidental Adrenal Gland Enlargement       Patient understands that today's visit is a follow up from recent hospitalization and that our services will provide a bridge in medical care until patient can be established with primary care.     The primary encounter diagnosis was Primary hypertension. Diagnoses of Acute saddle pulmonary embolism without acute cor pulmonale, Hospital discharge follow-up, Cigarette nicotine dependence without complication, and Obesity (BMI 30.0-34.9) were also pertinent to this visit.      Past Medical History:   Diagnosis Date    Abscess     Arthritis     HTN (hypertension)     Hyperlipidemia      Past Surgical History:   Procedure Laterality Date    ABDOMINAL SURGERY      APPENDECTOMY (OPEN)      APPENDECTOMY (OPEN)      LAPAROSCOPIC, CHOLECYSTECTOMY, CHOLANGIOGRAM N/A 06/27/2014    Procedure: LAPAROSCOPIC, CHOLECYSTECTOMY, CHOLANGIOGRAM;  Surgeon: Tyson Alias, MD;  [facility];  Service: General;  Laterality: N/A;    spleen surgery      not splenectomy     Family History   Problem Relation Age of Onset    Diabetes Father     Heart attack Father     Heart disease Father      Hyperlipidemia Father     Hypertension Father      Social History     Socioeconomic History    Marital status: Married   Tobacco Use    Smoking status: Every Day     Packs/day: 1.00     Years: 32.00     Pack years: 32.00     Types: Cigarettes    Smokeless tobacco: Never   Vaping Use    Vaping Use: Never used   Substance and Sexual Activity    Alcohol use: Not Currently    Drug use: Yes     Types: Marijuana, Other-see comments     Comment: marijuana daily in the evenings    Sexual activity: Yes     Allergies   Allergen Reactions    Morphine     Vicodin [Hydrocodone-Acetaminophen]        Current Outpatient Medications:     apixaban (ELIQUIS) 5 MG, Take 2 tablets (10 mg total) by mouth every 12 (twelve) hours for 7 days, THEN 1 tablet (5 mg total) every 12 (twelve) hours for 23 days., Disp: 74 tablet,  Rfl: 0      Objective:     There were no vitals filed for this visit.    Review of Systems      Physical Exam    Diagnostic Imaging:   CT Angiogram Chest (PE)    Result Date: 04/21/2021  1. Large saddle embolus in the main dome area arteries and multiple emboli in the branch vessels. 2. Right heart strain. 3. Hepatic steatosis 4. No focal infiltrates or pleural effusions 5. Bilateral adrenal gland enlargement left greater than right question underlying hyperplasia, carcinoma or adenomas further investigation suggested. Results phoned to ER physician. ReadingStation:[email or web address]    US Venous Low Extrem Duplx Dopp Comp Bilat    Result Date: 04/21/2021  Extensive right-sided DVT. Negative left leg without DVT seen. -RN Rodman Pickle notified at 6:10 PM by the sonographer. 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 Copious amount of clot was removed. Aspirated blood was then filter through the cell saver device and returned to the patient. Mechanical thrombectomy of  the left lower lobe pulmonary artery: 24 French aspiration catheter was then repositioned into the left main pulmonary artery. Bentson wire was inserted followed by the 5 [place] Berenstein catheter. Tip of wire was advanced into the left lower lobe pulmonary artery followed by the catheter. Wire was exchanged with a stiff wire and the aspiration catheter was advanced into the left lower lobe pulmonary artery. ReadingStation:[email or web address]    Result Date: 04/23/2021  1. Successful catheter directed thromboembolectomy for massive pulmonary embolus. 2. Patient's pulmonary arterial pressure significantly decreased post procedure. 3. No apparent complications. ReadingStation:[email or web address]      I have reviewed labs, radiographic images, other tests, and discharge notes from 9/24-9/27/22 and I agree with interpretation.       Assessment & Plan:     1. Primary hypertension        2. Acute saddle pulmonary embolism without acute cor pulmonale        3. Hospital discharge follow-up        4. Cigarette nicotine dependence without complication        5. Obesity (BMI 30.0-34.9)            Plan:  -   -   Follow up:  PCP:     Georgia Duff, DO  04/27/2021  8:41 AM

## 2021-04-30 ENCOUNTER — Encounter (INDEPENDENT_AMBULATORY_CARE_PROVIDER_SITE_OTHER): Payer: Self-pay | Admitting: Internal Medicine

## 2021-04-30 ENCOUNTER — Other Ambulatory Visit (INDEPENDENT_AMBULATORY_CARE_PROVIDER_SITE_OTHER): Payer: Self-pay

## 2021-04-30 ENCOUNTER — Ambulatory Visit (INDEPENDENT_AMBULATORY_CARE_PROVIDER_SITE_OTHER): Payer: Medicaid Other | Admitting: Internal Medicine

## 2021-04-30 VITALS — BP 148/100 | HR 116 | Temp 98.7°F | Resp 17 | Ht 74.5 in | Wt 237.8 lb

## 2021-04-30 DIAGNOSIS — F17210 Nicotine dependence, cigarettes, uncomplicated: Secondary | ICD-10-CM

## 2021-04-30 DIAGNOSIS — R7303 Prediabetes: Secondary | ICD-10-CM

## 2021-04-30 DIAGNOSIS — Z09 Encounter for follow-up examination after completed treatment for conditions other than malignant neoplasm: Secondary | ICD-10-CM

## 2021-04-30 DIAGNOSIS — I2692 Saddle embolus of pulmonary artery without acute cor pulmonale: Secondary | ICD-10-CM

## 2021-04-30 DIAGNOSIS — R030 Elevated blood-pressure reading, without diagnosis of hypertension: Secondary | ICD-10-CM

## 2021-04-30 DIAGNOSIS — I82461 Acute embolism and thrombosis of right calf muscular vein: Secondary | ICD-10-CM

## 2021-04-30 NOTE — Progress Notes (Signed)
NN met with patient and provider during [facility] appointment. Patient is agreeable to referral to [facility]. Informed patient clinic will likely transition him to a different anticoagulant. He verbalized understanding.    NN completed and submitted electronic application to [facility].    Vanessa Durham, BSN, ACM-RN  Ambulatory Navigator  [facility]  [address]  [address]  [place], [place] [address]  Main: [phone]  Direct: [phone]  Cell: [phone]

## 2021-04-30 NOTE — Patient Instructions (Signed)
Patient educated on importance of medical compliance with all medications and follow up visits. Patient had opportunity to ask questions and understood plan of care.  It was a pleasure taking care of Gary Vazquez,Gary Vazquez. during this visit.    Follow up plan w/ me prn    Patient will be establishing care w/ PCP at [facility]

## 2021-04-30 NOTE — Progress Notes (Signed)
[facility]   [facility] [address] [facility]  [address]  [place] [address]  Dept: [phone]  Dept Fax: [phone]     Visit Date: 04/30/2021    CC:   1. Acute saddle pulmonary embolism without acute cor pulmonale        2. Cigarette nicotine dependence without complication        3. Hospital discharge follow-up        4. Acute deep vein thrombosis (DVT) of calf muscle vein of right lower extremity        5. Prediabetes        6. Elevated blood pressure reading             HISTORY OF PRESENT ILLNESS  Gary Vazquez. is 55 y.o. male who comes in to [facility] after recent hospitalization where patient was admitted on 04/21/21 and discharged on 04/24/21     For full details of the patient's admission please consult the whole medical record including daily progress notes, history and physical, consult notes, lab reports as well as imaging studies.  Briefly:  He was in hospital w/ RLE swelling and found to have saddle embolus; admitted to ICU and underwent thrombectomy w/ IR. He also found to be pre-diabetic and incidental adrenal gland enlargement  Transitioned from heparin gtt to PO Eliquis     Today he is still having some discomfort in his RLE; still had right groin bandage and stitches in place. He denied any trouble breathing.    Patient understands that today's visit is a follow up from recent hospitalization and that our services will provide a bridge in medical care until patient can be established with primary care.     The primary encounter diagnosis was Acute saddle pulmonary embolism without acute cor pulmonale. Diagnoses of Cigarette nicotine dependence without complication, Hospital discharge follow-up, Acute deep vein thrombosis (DVT) of calf muscle vein of right lower extremity, Prediabetes, and Elevated blood pressure reading were also pertinent to this visit.      Past Medical History:   Diagnosis Date    Abscess     Arthritis      HTN (hypertension)     Hyperlipidemia      Past Surgical History:   Procedure Laterality Date    ABDOMINAL SURGERY      APPENDECTOMY (OPEN)      APPENDECTOMY (OPEN)      LAPAROSCOPIC, CHOLECYSTECTOMY, CHOLANGIOGRAM N/A 06/27/2014    Procedure: LAPAROSCOPIC, CHOLECYSTECTOMY, CHOLANGIOGRAM;  Surgeon: Tyson Alias, MD;  Location[facility];  Service: General;  Laterality: N/A;    spleen surgery      not splenectomy     Family History   Problem Relation Age of Onset    Diabetes Father     Heart attack Father     Heart disease Father     Hyperlipidemia Father     Hypertension Father      Social History     Socioeconomic History    Marital status: Married   Tobacco Use    Smoking status: Every Day     Packs/day: 0.50     Years: 32.00     Pack years: 16.00     Types: Cigarettes    Smokeless tobacco: Never    Tobacco comments:     Maybe 3 a day   Vaping Use    Vaping Use: Never used   Substance and Sexual  Activity    Alcohol use: Not Currently    Drug use: Yes     Types: Marijuana, Other-see comments     Comment: marijuana daily in the evenings    Sexual activity: Yes     Allergies   Allergen Reactions    Morphine     Vicodin [Hydrocodone-Acetaminophen]        Current Outpatient Medications:     apixaban (ELIQUIS) 5 MG, Take 2 tablets (10 mg total) by mouth every 12 (twelve) hours for 7 days, THEN 1 tablet (5 mg total) every 12 (twelve) hours for 23 days., Disp: 74 tablet, Rfl: 0      Objective:     Vitals:    04/30/21 1042   BP: (!) 148/100   Pulse: (!) 116   Resp: 17   Temp: 98.7 F (37.1 C)   SpO2: 97%       Review of Systems   Constitutional: Negative.    HENT: Negative.     Eyes: Negative.    Respiratory: Negative.     Cardiovascular: Negative.    Gastrointestinal: Negative.    Endocrine: Negative.    Genitourinary: Negative.    Musculoskeletal: Negative.         RLE pain   Skin: Negative.    Neurological: Negative.    Hematological: Negative.    Psychiatric/Behavioral: Negative.         Physical  Exam  Vitals and nursing note reviewed.   Constitutional:       Appearance: Normal appearance.   HENT:      Head: Normocephalic and atraumatic.      Nose: Nose normal.      Mouth/Throat:      Mouth: Mucous membranes are moist.   Eyes:      Extraocular Movements: Extraocular movements intact.      Conjunctiva/sclera: Conjunctivae normal.      Pupils: Pupils are equal, round, and reactive to light.   Cardiovascular:      Rate and Rhythm: Normal rate and regular rhythm.      Pulses: Normal pulses.      Heart sounds: Normal heart sounds.   Pulmonary:      Effort: Pulmonary effort is normal.      Breath sounds: Normal breath sounds.   Abdominal:      General: Abdomen is flat.      Palpations: Abdomen is soft.   Musculoskeletal:         General: Normal range of motion.      Cervical back: Normal range of motion and neck supple.   Skin:     General: Skin is warm and dry.   Neurological:      General: No focal deficit present.      Mental Status: He is alert and oriented to person, place, and time. Mental status is at baseline.   Psychiatric:         Mood and Affect: Mood normal.         Behavior: Behavior normal.         Thought Content: Thought content normal.         Judgment: Judgment normal.       Diagnostic Imaging:   CT Angiogram Chest (PE)    Result Date: 04/21/2021  1. Large saddle embolus in the main dome area arteries and multiple emboli in the branch vessels. 2. Right heart strain. 3. Hepatic steatosis 4. No focal infiltrates or pleural effusions 5. Bilateral adrenal gland enlargement left greater than  right question underlying hyperplasia, carcinoma or adenomas further investigation suggested. Results phoned to ER physician. ReadingStation:[email or web address]    US Venous Low Extrem Duplx Dopp Comp Bilat    Result Date: 04/21/2021  Extensive right-sided DVT. Negative left leg without DVT seen. -RN Rodman Pickle notified at 6:10 PM by the sonographer. 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 Copious amount of clot was removed. Aspirated blood was then filter through the cell saver device and returned to the patient. Mechanical thrombectomy of the left lower lobe pulmonary artery: 24 French aspiration catheter was then repositioned into the left main pulmonary artery. Bentson wire was inserted followed by the 5 [place] Berenstein catheter. Tip of wire was advanced into the left lower lobe pulmonary artery followed by the catheter. Wire was exchanged with a stiff wire and the aspiration catheter was advanced into the left lower lobe pulmonary artery. ReadingStation:[email or web address]    Result Date: 04/23/2021  1. Successful catheter directed thromboembolectomy for massive pulmonary embolus. 2. Patient's pulmonary arterial pressure significantly decreased post procedure. 3. No apparent complications. ReadingStation:[email or web address]      I have reviewed labs, radiographic images, other tests, and discharge notes from 9/24-9/27/22 and I agree with interpretation.       Assessment & Plan:     1. Acute saddle pulmonary embolism without acute cor pulmonale        2. Cigarette nicotine dependence without complication        3. Hospital discharge follow-up        4. Acute deep vein thrombosis (DVT) of calf muscle vein of right lower extremity        5. Prediabetes        6. 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

## 2021-05-11 ENCOUNTER — Other Ambulatory Visit
Admission: RE | Admit: 2021-05-11 | Discharge: 2021-05-11 | Disposition: A | Discharge status: Home / Self Care | Payer: Medicaid Other | Source: Ambulatory Visit | Attending: Family | Admitting: Family

## 2021-05-11 DIAGNOSIS — Z0000 Encounter for general adult medical examination without abnormal findings: Secondary | ICD-10-CM | POA: Insufficient documentation

## 2021-05-11 LAB — CBC AND DIFFERENTIAL
Basophils %: 1 % (ref 0.0–3.0)
Basophils Absolute: 0.1 10*3/uL (ref 0.0–0.3)
Eosinophils %: 5.2 % (ref 0.0–7.0)
Eosinophils Absolute: 0.4 10*3/uL (ref 0.0–0.8)
Hematocrit: 49.9 % (ref 39.0–52.5)
Hemoglobin: 16.6 gm/dL (ref 13.0–17.5)
Lymphocytes Absolute: 1.6 10*3/uL (ref 0.6–5.1)
Lymphocytes: 18.5 % (ref 15.0–46.0)
MCH: 33 pg (ref 28–35)
MCHC: 33 gm/dL (ref 32–36)
MCV: 98 fL (ref 80–100)
MPV: 7.8 fL (ref 6.0–10.0)
Monocytes Absolute: 0.7 10*3/uL (ref 0.1–1.7)
Monocytes: 8.4 % (ref 3.0–15.0)
Neutrophils %: 66.9 % (ref 42.0–78.0)
Neutrophils Absolute: 5.7 10*3/uL (ref 1.7–8.6)
PLT CT: 272 10*3/uL (ref 130–440)
RBC: 5.07 10*6/uL (ref 4.00–5.70)
RDW: 12.3 % (ref 11.0–14.0)
WBC: 8.5 10*3/uL (ref 4.0–11.0)

## 2021-05-12 LAB — COMPREHENSIVE METABOLIC PANEL
ALT: 29 U/L (ref 0–55)
AST (SGOT): 15 U/L (ref 10–42)
Albumin/Globulin Ratio: 1.34 Ratio (ref 0.80–2.00)
Albumin: 3.9 gm/dL (ref 3.5–5.0)
Alkaline Phosphatase: 69 U/L (ref 40–145)
Anion Gap: 15.8 mMol/L (ref 7.0–18.0)
BUN / Creatinine Ratio: 14.3 Ratio (ref 10.0–30.0)
BUN: 12 mg/dL (ref 7–22)
Bilirubin, Total: 0.3 mg/dL (ref 0.1–1.2)
CO2: 26 mMol/L (ref 20–30)
Calcium: 9.6 mg/dL (ref 8.5–10.5)
Chloride: 104 mMol/L (ref 98–110)
Creatinine: 0.84 mg/dL (ref 0.80–1.30)
EGFR: 102 mL/min/{1.73_m2} (ref 60–150)
Globulin: 2.9 gm/dL (ref 2.0–4.0)
Glucose: 154 mg/dL — ABNORMAL HIGH (ref 71–99)
Osmolality Calculated: 284 mOsm/kg (ref 275–300)
Potassium: 4.8 mMol/L (ref 3.5–5.3)
Protein, Total: 6.8 gm/dL (ref 6.0–8.3)
Sodium: 141 mMol/L (ref 136–147)

## 2021-05-12 LAB — HEMOGLOBIN A1C
Estimated Average Glucose: 140 mg/dL
Hgb A1C, %: 6.5 %

## 2021-05-12 LAB — HEPATITIS C ANTIBODY: Hepatitis C Antibody: NONREACTIVE

## 2021-05-12 LAB — HEPATITIS B SURFACE ANTIGEN W/ REFLEX TO CONFIRMATION: Hepatitis B Surface Antigen: NONREACTIVE

## 2021-05-12 LAB — LIPID PANEL
Cholesterol: 178 mg/dL (ref 75–199)
Coronary Heart Disease Risk: 4.14
HDL: 43 mg/dL (ref 40–55)
LDL Calculated: 106 mg/dL
Triglycerides: 146 mg/dL (ref 10–150)
VLDL: 29 (ref 0–40)

## 2021-05-12 LAB — TSH: TSH: 0.45 u[IU]/mL (ref 0.40–4.20)

## 2021-05-12 LAB — T4, FREE: T4 Free: 0.81 ng/dL (ref 0.70–1.48)

## 2021-05-12 LAB — HIV AG/AB 4TH GENERATION: HIV Ag/Ab, 4th Generation: NONREACTIVE

## 2021-05-12 LAB — HEPATITIS B SURF AB QUALITATIVE: HEPATITIS B SURFACE ANTIBODY: NONREACTIVE

## 2021-05-25 ENCOUNTER — Other Ambulatory Visit (INDEPENDENT_AMBULATORY_CARE_PROVIDER_SITE_OTHER): Payer: Self-pay

## 2021-05-25 NOTE — Progress Notes (Signed)
Patient established with [facility] for primary care.    [facility] NN will sign off.    Vanessa Durham, BSN, ACM-RN  Complex Care Coordinator  [facility]  [address]  [address]  [place], [place] [address]  Main: [phone]  Direct: [phone]  Cell: [phone]

## 2021-05-31 ENCOUNTER — Encounter (INDEPENDENT_AMBULATORY_CARE_PROVIDER_SITE_OTHER): Payer: Self-pay | Admitting: Family

## 2021-05-31 DIAGNOSIS — J449 Chronic obstructive pulmonary disease, unspecified: Secondary | ICD-10-CM

## 2021-06-01 ENCOUNTER — Other Ambulatory Visit
Admission: RE | Admit: 2021-06-01 | Discharge: 2021-06-01 | Disposition: A | Discharge status: Home / Self Care | Payer: Medicaid Other | Source: Ambulatory Visit | Attending: Family | Admitting: Family

## 2021-06-01 DIAGNOSIS — E119 Type 2 diabetes mellitus without complications: Secondary | ICD-10-CM | POA: Insufficient documentation

## 2021-06-01 LAB — COMPREHENSIVE METABOLIC PANEL
ALT: 27 U/L (ref 0–55)
AST (SGOT): 15 U/L (ref 10–42)
Albumin/Globulin Ratio: 1.38 Ratio (ref 0.80–2.00)
Albumin: 4 gm/dL (ref 3.5–5.0)
Alkaline Phosphatase: 65 U/L (ref 40–145)
Anion Gap: 12.6 mMol/L (ref 7.0–18.0)
BUN / Creatinine Ratio: 20.5 Ratio (ref 10.0–30.0)
BUN: 17 mg/dL (ref 7–22)
Bilirubin, Total: 0.4 mg/dL (ref 0.1–1.2)
CO2: 27 mMol/L (ref 20–30)
Calcium: 9.7 mg/dL (ref 8.5–10.5)
Chloride: 105 mMol/L (ref 98–110)
Creatinine: 0.83 mg/dL (ref 0.80–1.30)
EGFR: 102 mL/min/{1.73_m2} (ref 60–150)
Globulin: 2.9 gm/dL (ref 2.0–4.0)
Glucose: 129 mg/dL — ABNORMAL HIGH (ref 71–99)
Osmolality Calculated: 283 mOsm/kg (ref 275–300)
Potassium: 4.6 mMol/L (ref 3.5–5.3)
Protein, Total: 6.9 gm/dL (ref 6.0–8.3)
Sodium: 140 mMol/L (ref 136–147)

## 2021-06-01 LAB — HEMOGLOBIN A1C
Estimated Average Glucose: 134 mg/dL
Hgb A1C, %: 6.3 %

## 2021-06-04 ENCOUNTER — Ambulatory Visit (INDEPENDENT_AMBULATORY_CARE_PROVIDER_SITE_OTHER)
Admission: RE | Admit: 2021-06-04 | Discharge: 2021-06-04 | Disposition: A | Discharge status: Home / Self Care | Payer: Self-pay | Source: Ambulatory Visit | Attending: Family | Admitting: Family

## 2021-06-04 DIAGNOSIS — J449 Chronic obstructive pulmonary disease, unspecified: Secondary | ICD-10-CM

## 2021-07-06 ENCOUNTER — Emergency Department: Payer: Medicaid Managed Care Other

## 2021-07-06 ENCOUNTER — Emergency Department
Admission: EM | Admit: 2021-07-06 | Discharge: 2021-07-06 | Disposition: A | Discharge status: Home / Self Care | Payer: Medicaid Managed Care Other | Attending: Medical | Admitting: Medical

## 2021-07-06 DIAGNOSIS — W11XXXA Fall on and from ladder, initial encounter: Secondary | ICD-10-CM | POA: Insufficient documentation

## 2021-07-06 DIAGNOSIS — S43035A Inferior dislocation of left humerus, initial encounter: Secondary | ICD-10-CM | POA: Insufficient documentation

## 2021-07-06 DIAGNOSIS — M21822 Other specified acquired deformities of left upper arm: Secondary | ICD-10-CM

## 2021-07-06 MED ORDER — IBUPROFEN 600 MG PO TABS
600.0000 mg | ORAL_TABLET | Freq: Four times a day (QID) | ORAL | 0 refills | Status: DC | PRN
Start: 2021-07-06 — End: 2021-07-06

## 2021-07-06 MED ORDER — FENTANYL CITRATE (PF) 50 MCG/ML IJ SOLN (WRAP)
100.0000 ug | Freq: Once | INTRAMUSCULAR | Status: DC
Start: 2021-07-06 — End: 2021-07-06

## 2021-07-06 MED ORDER — PROPOFOL 200 MG/20ML IV EMUL
INTRAVENOUS | Status: AC | PRN
Start: 2021-07-06 — End: 2021-07-06
  Administered 2021-07-06: 50 mg via INTRAVENOUS

## 2021-07-06 MED ORDER — PROPOFOL 200 MG/20ML IV EMUL
INTRAVENOUS | Status: AC | PRN
Start: 2021-07-06 — End: 2021-07-06
  Administered 2021-07-06: 100 mg via INTRAVENOUS

## 2021-07-06 MED ORDER — SODIUM CHLORIDE 0.9 % IV SOLN
INTRAVENOUS | Status: AC | PRN
Start: 2021-07-06 — End: 2021-07-06
  Administered 2021-07-06: 125 mL/h via INTRAVENOUS

## 2021-07-06 MED ORDER — OXYCODONE-ACETAMINOPHEN 5-325 MG PO TABS
1.0000 | ORAL_TABLET | Freq: Four times a day (QID) | ORAL | 0 refills | Status: AC | PRN
Start: 2021-07-06 — End: ?

## 2021-07-06 NOTE — ED Triage Notes (Signed)
Patient presents with H/O fall from ladder with trauma to L arm/shoulder, he reports he was clearing leaves off a shed when he had the fall. Pain rated 10/10 on pain scale, numbness and tingling to the fingers on the left hand. No deformity, no open wounds noted. Trauma to the head but no LOC.

## 2021-07-06 NOTE — ED Notes (Signed)
Patient had reduction of L shoulder done, procedure was uneventful, boss now at bedside to drive patient home. Reviewed by PA allowed home with prescription for Percocets and instructions for follow up with Dr Weston Settle of Orthopedics on Monday.

## 2021-07-06 NOTE — ED Provider Notes (Signed)
[facility]  EMERGENCY DEPARTMENT  History and Physical Exam       Patient Name: Gary Vazquez,[name]  Encounter Date:  07/06/2021  Physician Assistant: Boykin Peek, PA-C  Attending Physician: Etter Sjogren, MD  PCP: Devota Pace, NP  Patient DOB:  [birth year]  MRN:  [id number]  Room:  E57/E57-A    History of Presenting Illness     Chief complaint: Shoulder Injury    HPI/ROS given by: Patient    Triage note: Pt feel about 5 ft off a ladder. INjured his left shoulder.    Gary Vazquez. is a 55 y.o. male who presents with left shoulder pain after fall from ladder.  Fell from 3 to 4 foot ladder onto left shoulder.  Since then, left arm is fully abducted and he is unable to abduct at all.  Localizes pain to left shoulder and axilla, no radiation of pain.  No other injuries.     Review of Systems     Review of Systems   Constitutional:  Negative for chills and fever.   Respiratory:  Negative for shortness of breath.    Cardiovascular:  Negative for chest pain.   Gastrointestinal:  Negative for abdominal pain.   Musculoskeletal:  Positive for arthralgias.   Neurological:  Negative for speech difficulty, weakness and numbness.   Psychiatric/Behavioral:  Negative for agitation.    All other systems reviewed and are negative.     Allergies & Medications     Pt is allergic to morphine and vicodin [hydrocodone-acetaminophen].    Discharge Medication List as of 07/06/2021  4:08 PM        CONTINUE these medications which have NOT CHANGED    Details   apixaban (ELIQUIS) 5 MG Take 5 mg by mouth every 12 (twelve) hours, Historical Med      buprenorphine-naloxone (SUBOXONE) 2-0.5 MG SL Tab per SL tablet Place under the tongue, Historical Med      escitalopram (LEXAPRO) 10 MG tablet escitalopram 10 mg tablet, Historical Med      lisinopril (ZESTRIL) 20 MG tablet Starting Mon 06/18/2021, Historical Med      metFORMIN (GLUCOPHAGE) 500 MG tablet Starting Mon 06/18/2021, Historical Med      Spiriva Respimat 2.5  MCG/ACT inhalation spray Starting Thu 05/24/2021, Historical Med              Past Medical History     Pt has a past medical history of Abscess, Arthritis, HTN (hypertension), and Hyperlipidemia.     Past Surgical History     Pt  has a past surgical history that includes APPENDECTOMY (OPEN); Abdominal surgery; APPENDECTOMY (OPEN); spleen surgery; and LAPAROSCOPIC, CHOLECYSTECTOMY, CHOLANGIOGRAM (N/A, 06/27/2014).     Family History     The family history includes Diabetes in his father; Heart attack in his father; Heart disease in his father; Hyperlipidemia in his father; Hypertension in his father.     Social History     Pt reports that he has been smoking cigarettes. He has a 16.00 pack-year smoking history. He has never used smokeless tobacco. He reports that he does not currently use alcohol. He reports current drug use. Drugs: Marijuana and Other-see comments.     Physical Exam     Blood pressure 137/80, pulse 85, temperature 98.4 F (36.9 C), resp. rate 17, SpO2 94 %.    Constitutional: Well developed, well nourished, active, in no apparent distress.  HENT:   Head: Normocephalic, atraumatic  Ears: No external lesions.  Nose: No external lesions. No epistaxis or drainage.  Eyes: PERRL. No scleral icterus. No conjunctival injection. EOMI.  Neck: Trachea is midline. No JVD. Normal range of motion. No apparent masses.  Cardiovascular: Regular rhythm, S1 normal and S2 normal. No murmur heard.  Pulmonary/Chest: Effort normal. Lungs clear to auscultation bilaterally.   Abdominal: Soft, non-tender, non-distended. No masses.   Genitourinay/Anorectal: Defferred  Musculoskeletal: Left shoulder range limited to, held in full abduction.  Neurovascularly intact throughout.  Neurological: Pt is alert. Cranial nerves are grossly intact. Moving all extremities without apparent deficit.   Psychiatric: Affect is appropriate. There is no agitation.   Skin: Skin is warm, dry, well perfused. No rash noted.      Diagnostic Results      The results of the diagnostic studies below have been reviewed by myself:    Labs  Results       ** No results found for the last 24 hours. **            Radiologic Studies  XR Shoulder Left 2+ Views    Result Date: 07/06/2021  Reduced glenohumeral dislocation with Hill-Sachs type deformity involving the cortex of the superior lateral part of the humerus ReadingStation:[id number]    XR Shoulder Left 2+ Views    Result Date: 07/06/2021  Abducted humerus, and suspect some degree of inferior dislocation although not well delineated on images obtained. ReadingStation:[id number]     EKG:      ED Meds     ED Medication Orders (From admission, onward)      Start Ordered     Status Ordering Provider    07/06/21 1440 07/06/21 1445  propofol (DIPRIVAN) injection  Code/trauma/sedation medication        Route: Intravenous     Last MAR action: Given WATTS, DAVID J    07/06/21 1436 07/06/21 1437  propofol (DIPRIVAN) injection  Code/trauma/sedation medication        Route: Intravenous     Last MAR action: Given WATTS, DAVID J    07/06/21 1430 07/06/21 1549  0.9% NaCl infusion  Code/trauma/sedation continuous med        Route: Intravenous     Last MAR action: New Bag WATTS, DAVID J    07/06/21 1321 07/06/21 1320    Once in ED        Route: Intravenous  Ordered Dose: 100 mcg     Discontinued Boykin Peek             ED Course and Medical Decision Making     Old medical records and nursing/triage notes were reviewed by myself    DIAGNOSTIC CONSIDERATIONS    Dislocation, subluxation, fracture, sprain    CONSULTS    Dr. Weston Settle (orthopedics)-discussed case, independently reviewed imaging.  Recommended attempted closed reduction with postreduction shoulder x-rays including axillary view    ED COURSE & MDM    Patient evaluated for inferior dislocation left shoulder after fall.  Appears to be an isolated injury.  Case discussed with orthopedics due to the unusual presentation of the dislocation (luxatio erecta).  Successful closed  reduction, post x-ray demonstrates adequate reduction with a Hill-Sachs deformity.  Recovered well from sedation.  Placed in a sling.  Advised orthopedic follow-up.  Patient and friend verbalized understanding of follow-up instructions.  Was prescribed Percocet.  NSAIDs relatively contraindicated due to anticoagulation.  Recommend ice 3 times daily.    In addition to the above history, please see nursing notes. Allergies, meds, past medical, family, social hx,  and the results of the diagnostic studies performed have been reviewed by myself.    This chart was generated by an EMR and may contain errors or omissions not intended by the user.     Procedures / Critical Care     PROCEDURAL SEDATION   By Birdena Jubilee, MD    Sedation Physician: Etter Sjogren    Planned Sedation level: Deep Sedation   Procedure/Assisting Provider: Mat Carne    ASA Classification: ASA 2  Last PO intake assessed           Mallampati score: II (hard and soft palate, upper portion of tonsils and uvula visible)  Other considerations: None      History, physical,and sedation plan done prior to performing sedation.     Patient's identity was confirmed  Patient condition reviewed prior to sedation by physician  Indication: orthopedic reduction  Sedative: propofol      Patient was oxygenated with nasal canula and/or non-rebreather.  Please see nursing sedation procedure notes for additional history, specific dosing, record of vitals during, and the timeline of the procedure.        Post Anesthesia Evaluation:  Patient able to participate in evaluation.  I have examined the patient and reviewed the chart for: respiratory function, cardiovascular function, mental status, temperature, pain, nausea/vomiting, and post-operative hydration to determine recovery from anesthesia and presence of complications:  Yes     Recovery from anesthesia adequate: Yes  Complications: No    Duration of Sedation:  10-22 minutes  Time from first sedation medication  given until the physician can safely leave bedside for patient to recover      Closed Reduction of Dislocation  By Boykin Peek, PA  2:17 PM    Indication: Inferior dislocation of left shoulder  Location: Left Shoulder    Verbal consent.  Patient identified and location verified.  Traction/countertraction was applied along the axis of the abducted humerus.  Gentle gradual abduction of the arm was then performed, which resulted in reduction.  Sling was applied.  Post x-rays confirm reduction.  Patient tolerated procedure well with no complications.     Diagnosis / Disposition     Clinical Impression  1. Inferior dislocation of left shoulder, initial encounter    2. Fall from ladder, initial encounter    3. Hill Sachs deformity, left        Disposition  ED Disposition       ED Disposition   Discharge    Condition   --    Date/Time   Fri Jul 06, 2021  3:52 PM    Comment   Gary Merwin. discharge to home/self care.    Condition at disposition: Stable                 Follow up for Discharged Patients  Amedeo Plenty, MD  [address]  [place] [address]  [phone]    Schedule an appointment as soon as possible for a visit       Prescriptions for Discharged Patients  Discharge Medication List as of 07/06/2021  4:08 PM        START taking these medications    Details   oxyCODONE-acetaminophen (PERCOCET) 5-325 MG per tablet Take 1-2 tablets by mouth every 6 (six) hours as needed for Pain, Starting Fri 07/06/2021, E-Rx                       Boykin Peek, PA  07/06/21 1825       Etter Sjogren, MD  07/07/21 1159

## 2021-07-06 NOTE — Consults (Signed)
Consult for SBIRT  Consult performed by: Etter Sjogren, MD  Consult ordered by: Etter Sjogren, MD  Assessment/Recommendations: Patient not appropriate for consult at this time due to current medical condition.

## 2021-08-03 ENCOUNTER — Other Ambulatory Visit
Admission: RE | Admit: 2021-08-03 | Discharge: 2021-08-03 | Disposition: A | Discharge status: Home / Self Care | Payer: Medicaid Managed Care Other | Source: Ambulatory Visit | Attending: Family | Admitting: Family

## 2021-08-03 LAB — SEDIMENTATION RATE: Sed Rate: 13 mm/hr (ref 0–20)

## 2021-08-03 LAB — RHEUMATOID FACTOR: Rheumatoid Factor: <15 IU/mL (ref 0.0–29.9)

## 2021-08-03 LAB — C-REACTIVE PROTEIN: C-Reactive Protein: 0.45 mg/dL (ref 0.02–0.80)

## 2021-08-06 LAB — LYME AB, TOTAL,REFLEX TO WESTERN BLOT (IGG & IGM): Lyme AB: NONREACTIVE

## 2021-08-09 LAB — VH REFLEX ANA TITER AND PATTERN: ANA Titer: 1:40 {titer} — AB

## 2021-08-09 LAB — ANA SCREEN, IFA, WITH REFLEX TO TITER AND PATTERN: ANA Screen, IFA: POSITIVE — AB

## 2021-10-29 ENCOUNTER — Other Ambulatory Visit
Admission: RE | Admit: 2021-10-29 | Discharge: 2021-10-29 | Disposition: A | Discharge status: Home / Self Care | Payer: Medicaid Managed Care Other | Source: Ambulatory Visit | Attending: Family | Admitting: Family

## 2021-10-29 DIAGNOSIS — E119 Type 2 diabetes mellitus without complications: Secondary | ICD-10-CM | POA: Insufficient documentation

## 2021-10-29 DIAGNOSIS — Z7984 Long term (current) use of oral hypoglycemic drugs: Secondary | ICD-10-CM | POA: Insufficient documentation

## 2021-10-30 LAB — HEMOGLOBIN A1C
Estimated Average Glucose: 105 mg/dL
Hgb A1C, %: 5.3 %

## 2021-10-30 LAB — CBC AND DIFFERENTIAL
Basophils %: 0.9 % (ref 0.0–3.0)
Basophils Absolute: 0.1 10*3/uL (ref 0.0–0.3)
Eosinophils %: 4 % (ref 0.0–7.0)
Eosinophils Absolute: 0.3 10*3/uL (ref 0.0–0.8)
Hematocrit: 50.9 % (ref 39.0–52.5)
Hemoglobin: 15.9 gm/dL (ref 13.0–17.5)
Lymphocytes Absolute: 1.7 10*3/uL (ref 0.6–5.1)
Lymphocytes: 23.9 % (ref 15.0–46.0)
MCH: 31 pg (ref 28–35)
MCHC: 31 gm/dL — ABNORMAL LOW (ref 32–36)
MCV: 100 fL (ref 80–100)
MPV: 8.9 fL (ref 6.0–10.0)
Monocytes Absolute: 0.8 10*3/uL (ref 0.1–1.7)
Monocytes: 11.3 % (ref 3.0–15.0)
Neutrophils %: 60 % (ref 42.0–78.0)
Neutrophils Absolute: 4.4 10*3/uL (ref 1.7–8.6)
PLT CT: 230 10*3/uL (ref 130–440)
RBC: 5.07 10*6/uL (ref 4.00–5.70)
RDW: 12.6 % (ref 11.0–14.0)
WBC: 7.3 10*3/uL (ref 4.0–11.0)

## 2021-10-30 LAB — COMPREHENSIVE METABOLIC PANEL
ALT: 90 U/L — ABNORMAL HIGH (ref 0–55)
AST (SGOT): 36 U/L (ref 10–42)
Albumin/Globulin Ratio: 1.41 Ratio (ref 0.80–2.00)
Albumin: 3.8 gm/dL (ref 3.5–5.0)
Alkaline Phosphatase: 72 U/L (ref 40–145)
Anion Gap: 10.5 mMol/L (ref 7.0–18.0)
BUN / Creatinine Ratio: 20 Ratio (ref 10.0–30.0)
BUN: 17 mg/dL (ref 7–22)
Bilirubin, Total: 0.3 mg/dL (ref 0.1–1.2)
CO2: 26 mMol/L (ref 20–30)
Calcium: 9.5 mg/dL (ref 8.5–10.5)
Chloride: 107 mMol/L (ref 98–110)
Creatinine: 0.85 mg/dL (ref 0.80–1.30)
EGFR: 101 mL/min/{1.73_m2} (ref 60–150)
Globulin: 2.7 gm/dL (ref 2.0–4.0)
Glucose: 142 mg/dL — ABNORMAL HIGH (ref 71–99)
Osmolality Calculated: 282 mOsm/kg (ref 275–300)
Potassium: 4.5 mMol/L (ref 3.5–5.3)
Protein, Total: 6.5 gm/dL (ref 6.0–8.3)
Sodium: 139 mMol/L (ref 136–147)

## 2021-10-30 LAB — LIPID PANEL
Cholesterol: 160 mg/dL (ref 75–199)
Coronary Heart Disease Risk: 3.27
HDL: 49 mg/dL (ref 40–55)
LDL Calculated: 85 mg/dL
Triglycerides: 128 mg/dL (ref 10–150)
VLDL: 26 (ref 0–40)

## 2022-04-10 ENCOUNTER — Emergency Department: Payer: Medicaid Managed Care Other

## 2022-04-10 ENCOUNTER — Emergency Department
Admission: EM | Admit: 2022-04-10 | Discharge: 2022-04-10 | Disposition: A | Discharge status: Home / Self Care | Payer: Medicaid Managed Care Other | Attending: Student in an Organized Health Care Education/Training Program | Admitting: Student in an Organized Health Care Education/Training Program

## 2022-04-10 DIAGNOSIS — R1031 Right lower quadrant pain: Secondary | ICD-10-CM | POA: Insufficient documentation

## 2022-04-10 DIAGNOSIS — Z79899 Other long term (current) drug therapy: Secondary | ICD-10-CM | POA: Insufficient documentation

## 2022-04-10 DIAGNOSIS — W010XXA Fall on same level from slipping, tripping and stumbling without subsequent striking against object, initial encounter: Secondary | ICD-10-CM | POA: Insufficient documentation

## 2022-04-10 DIAGNOSIS — M5431 Sciatica, right side: Secondary | ICD-10-CM

## 2022-04-10 DIAGNOSIS — E785 Hyperlipidemia, unspecified: Secondary | ICD-10-CM | POA: Insufficient documentation

## 2022-04-10 DIAGNOSIS — I10 Essential (primary) hypertension: Secondary | ICD-10-CM | POA: Insufficient documentation

## 2022-04-10 DIAGNOSIS — M5441 Lumbago with sciatica, right side: Secondary | ICD-10-CM | POA: Insufficient documentation

## 2022-04-10 DIAGNOSIS — R519 Headache, unspecified: Secondary | ICD-10-CM

## 2022-04-10 LAB — CBC AND DIFFERENTIAL
Basophils %: 0 % (ref 0.0–3.0)
Basophils Absolute: 0 10*3/uL (ref 0.0–0.3)
Eosinophils %: 3 % (ref 0.0–7.0)
Eosinophils Absolute: 0.2 10*3/uL (ref 0.0–0.8)
Hematocrit: 51.8 % (ref 39.0–52.5)
Hemoglobin: 16.9 gm/dL (ref 13.0–17.5)
Lymphocytes Absolute: 1.6 10*3/uL (ref 0.6–5.1)
Lymphocytes: 20 % (ref 15.0–46.0)
MCH: 31 pg (ref 28–35)
MCHC: 33 gm/dL (ref 32–36)
MCV: 96 fL (ref 80–100)
MPV: 8.1 fL (ref 6.0–10.0)
Monocytes Absolute: 0.2 10*3/uL (ref 0.1–1.7)
Monocytes: 3 % (ref 3.0–15.0)
Neutrophils %: 74 % (ref 42.0–78.0)
Neutrophils Absolute: 6 10*3/uL (ref 1.7–8.6)
PLT CT: 278 10*3/uL (ref 130–440)
RBC: 5.39 10*6/uL (ref 4.00–5.70)
RDW: 12.1 % (ref 11.0–14.0)
WBC: 8.1 10*3/uL (ref 4.0–11.0)

## 2022-04-10 LAB — COMPREHENSIVE METABOLIC PANEL
ALT: 20 U/L (ref 0–55)
AST (SGOT): 15 U/L (ref 10–42)
Albumin/Globulin Ratio: 1.19 Ratio (ref 0.80–2.00)
Albumin: 3.8 gm/dL (ref 3.5–5.0)
Alkaline Phosphatase: 55 U/L (ref 40–145)
Anion Gap: 13.8 mMol/L (ref 7.0–18.0)
BUN / Creatinine Ratio: 20.7 Ratio (ref 10.0–30.0)
BUN: 17 mg/dL (ref 7–22)
Bilirubin, Total: 0.4 mg/dL (ref 0.1–1.2)
CO2: 27 mMol/L (ref 20–30)
Calcium: 9.7 mg/dL (ref 8.5–10.5)
Chloride: 102 mMol/L (ref 98–110)
Creatinine: 0.82 mg/dL (ref 0.80–1.30)
EGFR: 102 mL/min/{1.73_m2} (ref 60–150)
Globulin: 3.2 gm/dL (ref 2.0–4.0)
Glucose: 143 mg/dL — ABNORMAL HIGH (ref 71–99)
Osmolality Calculated: 282 mOsm/kg (ref 275–300)
Potassium: 3.8 mMol/L (ref 3.5–5.3)
Protein, Total: 7 gm/dL (ref 6.0–8.3)
Sodium: 139 mMol/L (ref 136–147)

## 2022-04-10 LAB — ECG 12-LEAD
P Wave Axis: 55 deg
P-R Interval: 169 ms
Patient Age: 56 years
Q-T Interval(Corrected): 452 ms
Q-T Interval: 380 ms
QRS Axis: 15 deg
QRS Duration: 104 ms
T Axis: 61 years
Ventricular Rate: 85 //min

## 2022-04-10 LAB — PT/INR
PT INR: 1 (ref 0.9–1.1)
PT: 10.3 s (ref 9.7–11.8)

## 2022-04-10 MED ORDER — PREDNISONE 20 MG PO TABS
40.0000 mg | ORAL_TABLET | Freq: Every day | ORAL | 0 refills | Status: AC
Start: 2022-04-11 — End: 2022-04-15

## 2022-04-10 MED ORDER — PREDNISONE 20 MG PO TABS
ORAL_TABLET | ORAL | Status: AC
Start: 2022-04-10 — End: ?
  Filled 2022-04-10: qty 2

## 2022-04-10 MED ORDER — PREDNISONE 20 MG PO TABS
40.0000 mg | ORAL_TABLET | Freq: Once | ORAL | Status: AC
Start: 2022-04-10 — End: 2022-04-10
  Administered 2022-04-10: 40 mg via ORAL

## 2022-04-10 MED ORDER — GABAPENTIN 300 MG PO CAPS
300.0000 mg | ORAL_CAPSULE | Freq: Once | ORAL | Status: AC
Start: 2022-04-10 — End: 2022-04-10
  Administered 2022-04-10: 300 mg via ORAL

## 2022-04-10 MED ORDER — IOHEXOL 350 MG/ML IV SOLN
100.0000 mL | Freq: Once | INTRAVENOUS | Status: AC | PRN
Start: 2022-04-10 — End: 2022-04-10
  Administered 2022-04-10: 100 mL via INTRAVENOUS

## 2022-04-10 MED ORDER — GABAPENTIN 300 MG PO CAPS
ORAL_CAPSULE | ORAL | Status: AC
Start: 2022-04-10 — End: ?
  Filled 2022-04-10: qty 1

## 2022-04-10 MED ORDER — GABAPENTIN 100 MG PO CAPS
300.0000 mg | ORAL_CAPSULE | Freq: Three times a day (TID) | ORAL | 0 refills | Status: AC | PRN
Start: 2022-04-10 — End: 2022-04-24

## 2022-04-10 MED ORDER — VH SODIUM CHLORIDE 0.9 % IV BOLUS
500.0000 mL | Freq: Once | INTRAVENOUS | Status: AC
Start: 2022-04-10 — End: 2022-04-10
  Administered 2022-04-10: 500 mL via INTRAVENOUS

## 2022-04-10 NOTE — Discharge Instructions (Signed)
Please follow-up with your primary physician as well as the [facility] at the number below.    If you develop any worsening or severe pain, difficulties going to the bathroom, or unable to walk or any other new or concerning symptom please return to the emergency department.

## 2022-04-10 NOTE — ED Provider Notes (Signed)
[facility]  EMERGENCY DEPARTMENT  History and Physical Exam     Patient Name: Gary Vazquez,[name]  Encounter Date:  04/10/2022  Attending Physician: Angelina Sheriff, MD  Room:  C19/C19-A  Patient DOB:  Jun 15, 1966  Age: 56 y.o. male  MRN:  [id number]  PCP: Devota Pace, NP      Diagnosis/Disposition:  MDM:     Final Impression  1. Sciatica of right side        Disposition  ED Disposition       ED Disposition   Discharge    Condition   --    Date/Time   Wed Apr 10, 2022 12:45 PM    Comment   Gary Vazquez. discharge to home/self care.    Condition at disposition: Stable                 Follow up  Devota Pace, NP  [address]  [place] [address]  [phone]    Schedule an appointment as soon as possible for a visit       [facility]  [address]  [place] [address]  [phone]  Schedule an appointment as soon as possible for a visit       [facility]  [address]  [place] [address]  [phone]  Go to   If symptoms worsen      PRESCRIPTION MANAGEMENT    New Meds:   New Prescriptions    GABAPENTIN (NEURONTIN) 100 MG CAPSULE    Take 3 capsules (300 mg) by mouth 3 (three) times daily as needed (pain)    PREDNISONE (DELTASONE) 20 MG TABLET    Take 2 tablets (40 mg) by mouth daily for 4 days        Medications Changed:   Modified Medications    No medications on file       Discontinued Meds:   Discontinued Medications    No medications on file       ED Course as of 04/10/22 1249   Wed Apr 10, 2022   6440 04/24/21 discharge summary reviewed, patient was admitted for saddle PE.  Still on Eliquis. [EH]   594 55 year old male history as below including intermittent/chronic back pain with degenerative disc disease as well as previous PE on Eliquis presents with low back and groin pain after a fall 1 week ago.  Denies hitting his head or any LOC, reports pain in the lower back, worse on the right side as well  as in the right groin.  Denies any saddle anesthesia, numbness, tingling or weakness in the legs.  No loss of bowel or bladder continence or urinary retention.  Ambulatory but says he cannot stand or walk for more than a few minutes at a time.  No additional falls   5 out of 5 power in bilateral lower extremities, ambulatory with steady gait.  Normal sensation in lower extremities.  No midline tenderness to spine. [EH]   1120 CT Head WO Contrast  Image(s) reviewed, my independent interpretation: No intracranial mass or hemorrhage noted. [EH]   1121 CT Abdomen Pelvis with IV Cont  Image(s) reviewed, my independent interpretation: No free air or free fluid noted. [EH]      ED Course User Index  [EH] Estill Cotta, MD       My personal imaging interpretations are preliminary and pending formal read by radiologist.  Chief complaint: Fall, back pain    Differential Diagnosis Considered: The differential diagnosis includes, but is not limited to acute myofascial strain, low back pain (acute, chronic), fracture, contusion, spondylolisthesis, aortic abdominal aneurysm, acute sciatica, acute herniated disc, degenerative disc disease, urinary tract infection/pyelonephritis, kidney stone,malignancy, metastatic disease, acute cord compression, cauda equina syndrome, infection (epidural abscess, osteomyelitis), hemoperitoneum, hollow or solid organ intra-abdominal injury      DISCUSSION:  56 year old male with history as below including PE on Eliquis presents with right-sided low back pain, mild right lower quadrant/right groin pain for approximately 1 week after fall.  Benign abdominal exam however given anticoagulated status with 1 week of symptoms broad work-up initiated and was reassuring.  Presentation and work-up in the ED consistent with lumbar radiculopathy/sciatica, low suspicion for acute spine fracture or cord compression, hemoperitoneum or significant intra-abdominal injury, intracranial hemorrhage or other  concerning traumatic, intra-abdominal, infectious, spinal or metabolic process.  Plan for discharge and follow up with PCP, and other outpatient specialty clinics if applicable, was discussed with patient in a shared decision making manner.  Return precautions discussed, patient and/or guardian verbalized understanding and agreement with this plan.    Meds Given In ED: (Red Colored Meds indicate High Risk Medications that Required Monitoring for adverse effects or deterioration:    Medications   predniSONE (DELTASONE) tablet 40 mg (has no administration in time range)   sodium chloride 0.9 % bolus 500 mL (500 mLs Intravenous New Bag 04/10/22 1034)   gabapentin (NEURONTIN) capsule 300 mg (300 mg Oral Given 04/10/22 1034)   iohexol (OMNIPAQUE) 350 MG/ML injection 100 mL (100 mLs Intravenous Imaging Agent Given 04/10/22 1113)                 History of Presenting Illness:     Nursing Triage note: Larey Seat last tuesday, fell forward and caught himself. Having lower abd. pain and in back since then. denies hitting head/LOC. Can only stand 10-15 min at a time. Tkes blood thinner. + SOB on exertion.    Gary Vazquez. is a 56 y.o. male presenting with R low back pain    See ED Course for additional HPI    Review of Systems:  A complete review of at least 10 systems was performed.  Patient reported negative findings except as noted in the ED course or as documented elsewhere.      Physical Exam  MDM Complexity     Blood pressure 127/87, pulse 82, temperature 97.6 F (36.4 C), temperature source Oral, resp. rate 17, height 1.88 m, weight 108.4 kg, SpO2 95 %.  Reviewed vital signs and nursing note as charted by RN/Triage.     Constitutional: Alert, non-toxic appearing.  Eyes: Conjunctivae normal, sclera non-icteric  ENT       Head: Normocephalic, atraumatic.       Mouth/Throat: Mucous membranes are moist.  Neck: Normal ROM, no tracheal deviation  Cardiovascular: Skin is well perfused, bilateral radial pulses palpable and  equal.  Respiratory: Normal respiratory effort. No respiratory distress.   Abdominal: Non-distended.  Genitourinary: Deferred  Musculoskeletal: No obvious deformities.  Neurologic: Alert, behavior appropriate for age.  Skin: Warm, dry.      See ED Course for additional exam findings.    MDM Complexity:    History obtained from:Patient  HPI/ROS is limited by: none    Past Medical Records reviewed: Previous [place] Summary  - See ED course for details    Medical Conditions that Impact Care Today:  Past Medical History:   Diagnosis Date    Abscess     Arthritis     HTN (hypertension)     Hyperlipidemia      Prescription Management: See Diagnosis/Disposition area for Prescription Management details    Social Determinants of Health that Impact Care: None that negatively impact care    Available past medical, family, social, and surgical histories have been reviewed by myself. The clinical impression and plan have been discussed with the patient and/or the patient's family. All questions have been answered.     Diagnostic Results:     LAB STUDIES    All lab value have been personally reviewed by me    Results       Procedure Component Value Units Date/Time    CBC and differential [[id number]] Collected: 04/10/22 0957    Specimen: Blood Updated: 04/10/22 1123     WBC 8.1 K/cmm      RBC 5.39 M/cmm      Hemoglobin 16.9 gm/dL      Hematocrit 82.9 %      MCV 96 fL      MCH 31 pg      MCHC 33 gm/dL      RDW 56.2 %      PLT CT 278 K/cmm      MPV 8.1 fL      Neutrophils % 74.0 %      Lymphocytes 20.0 %      Monocytes 3.0 %      Eosinophils % 3.0 %      Basophils % 0.0 %      Neutrophils Absolute 6.0 K/cmm      Lymphocytes Absolute 1.6 K/cmm      Monocytes Absolute 0.2 K/cmm      Eosinophils Absolute 0.2 K/cmm      Basophils Absolute 0.0 K/cmm      RBC Morphology Morphology Consistent with Hemogram    Narrative:      Manual differential performed    Comprehensive metabolic panel [[id number]]  (Abnormal) Collected: 04/10/22 0957     Specimen: Plasma Updated: 04/10/22 1034     Sodium 139 mMol/L      Potassium 3.8 mMol/L      Chloride 102 mMol/L      CO2 27 mMol/L      Calcium 9.7 mg/dL      Glucose 696 mg/dL      Creatinine 2.95 mg/dL      BUN 17 mg/dL      Protein, Total 7.0 gm/dL      Albumin 3.8 gm/dL      Alkaline Phosphatase 55 U/L      ALT 20 U/L      AST (SGOT) 15 U/L      Bilirubin, Total 0.4 mg/dL      Albumin/Globulin Ratio 1.19 Ratio      Anion Gap 13.8 mMol/L      BUN / Creatinine Ratio 20.7 Ratio      EGFR 102 mL/min/1.76m2      Osmolality Calculated 282 mOsm/kg      Globulin 3.2 gm/dL     Prothrombin time/INR [[id number]] Collected: 04/10/22 0957    Specimen: Blood Updated: 04/10/22 1028     PT 10.3 sec      PT INR 1.0    Collect Blood [[id number]] Collected: 04/10/22 0957    Specimen: Other Updated: 04/10/22 0957            RADIOLOGIC STUDIES    All images have been  personally viewed by me and are pending formal read by radiology.    CT Abdomen Pelvis with IV Cont    Result Date: 04/10/2022  1.  No acute intra-abdominal sequelae of trauma are identified. 2.  Mild sigmoid diverticulosis without evidence of diverticulitis. 3.  Mild hepatic steatosis. 4.  Additional incidental findings as detailed above. ReadingStation:[email or web address]    CT Head WO Contrast    Result Date: 04/10/2022  No acute intracranial abnormality. ReadingStation:[email or web address]       EKG:     EKG:   Last EKG Result       Procedure Component Value Units Date/Time    ECG 12 lead [[id number]] Collected: 04/10/22 0916     Updated: 04/10/22 1033     Patient Age 65 years      Patient DOB [birth year]     Patient Height --     Patient Weight --     Interpretation Text --     Sinus rhythm  Probable left atrial enlargement  Abnormal R-wave progression, early transition  Compared to ECG 04/21/2021 15:00:44  No significant change  Electronically Signed On 04-10-2022 10:33:33 EDT by Angelina Sheriff       Physician Interpreter [facility]     Ventricular Rate 85 //min      QRS Duration 104 ms       P-R Interval 169 ms      Q-T Interval 380 ms      Q-T Interval(Corrected) 452 ms      P Wave Axis 55 deg      QRS Axis 15 deg      T Axis 61 years              PROCEDURES          ORDERS PLACED THIS VISIT     Orders  Orders Placed This Encounter   Procedures    CT Abdomen Pelvis with IV Cont    CT Head WO Contrast    Collect Blood    CBC and differential    Comprehensive metabolic panel    Prothrombin time/INR    Consult for SBIRT    ECG 12 lead              Allergies & Medications:     Pt is allergic to morphine and vicodin [hydrocodone-acetaminophen].    Current/Home Medications    AMLODIPINE (NORVASC) 10 MG TABLET    Take 1 tablet (10 mg) by mouth daily    APIXABAN (ELIQUIS) 5 MG    Take 1 tablet (5 mg) by mouth every 12 (twelve) hours    BUPRENORPHINE-NALOXONE (SUBOXONE) 2-0.5 MG SL TAB PER SL TABLET    Place under the tongue    ESCITALOPRAM (LEXAPRO) 10 MG TABLET    escitalopram 10 mg tablet    HYDROCHLOROTHIAZIDE (HYDRODIURIL) 25 MG TABLET    Take 1 tablet (25 mg) by mouth daily    LISINOPRIL (ZESTRIL) 20 MG TABLET        METFORMIN (GLUCOPHAGE) 500 MG TABLET        OXYCODONE-ACETAMINOPHEN (PERCOCET) 5-325 MG PER TABLET    Take 1-2 tablets by mouth every 6 (six) hours as needed for Pain    SPIRIVA RESPIMAT 2.5 MCG/ACT INHALATION SPRAY               Past History:     Family:   Family History   Problem Relation Age of Onset    Diabetes Father     Heart attack Father  Heart disease Father     Hyperlipidemia Father     Hypertension Father        Social:  reports that he has been smoking cigarettes. He has a 16.00 pack-year smoking history. He has never used smokeless tobacco. He reports that he does not currently use alcohol. He reports current drug use. Drugs: Marijuana and Other-see comments.        ATTESTATIONS     Angelina Sheriff, MD    The results of diagnostic studies have been reviewed by myself. The above past medical, family, social, and surgical histories have been reviewed by myself. The clinical  impression and plan have been discussed with the patient and/or the patient's family. All questions have been answered.    Note:  This chart was generated by an EMR and may contain errors, including typographical, or omissions not intended by the user. This chart was generated by the Epic EMR system/speech recognition and may contain inherent errors or omissions not intended by the user. Grammatical errors, random word insertions, deletions, pronoun errors and incomplete sentences are occasional consequences of this technology due to software limitations. Not all errors are caught or corrected. If there are questions or concerns about the content of this note or information contained within the body of this dictation they should be addressed directly with the author for clarification            Estill Cotta, MD  04/10/22 1249

## 2022-04-11 LAB — VH EXTRA SPECIMEN LABEL

## 2022-05-14 ENCOUNTER — Other Ambulatory Visit
Admission: RE | Admit: 2022-05-14 | Discharge: 2022-05-14 | Disposition: A | Discharge status: Home / Self Care | Payer: Medicaid Managed Care Other | Source: Ambulatory Visit | Attending: Family | Admitting: Family

## 2022-05-14 DIAGNOSIS — Z0000 Encounter for general adult medical examination without abnormal findings: Secondary | ICD-10-CM | POA: Insufficient documentation

## 2022-05-14 LAB — CBC AND DIFFERENTIAL
Basophils %: 0.6 % (ref 0.0–3.0)
Basophils Absolute: 0 10*3/uL (ref 0.0–0.3)
Eosinophils %: 1 % (ref 0.0–7.0)
Eosinophils Absolute: 0.1 10*3/uL (ref 0.0–0.8)
Hematocrit: 49.3 % (ref 39.0–52.5)
Hemoglobin: 16.7 gm/dL (ref 13.0–17.5)
Lymphocytes Absolute: 2.2 10*3/uL (ref 0.6–5.1)
Lymphocytes: 27 % (ref 15.0–46.0)
MCH: 32 pg (ref 28–35)
MCHC: 34 gm/dL (ref 32–36)
MCV: 96 fL (ref 80–100)
MPV: 8.2 fL (ref 6.0–10.0)
Monocytes Absolute: 0.6 10*3/uL (ref 0.1–1.7)
Monocytes: 7.5 % (ref 3.0–15.0)
Neutrophils %: 63.8 % (ref 42.0–78.0)
Neutrophils Absolute: 5.2 10*3/uL (ref 1.7–8.6)
PLT CT: 279 10*3/uL (ref 130–440)
RBC: 5.15 10*6/uL (ref 4.00–5.70)
RDW: 12.3 % (ref 11.0–14.0)
WBC: 8.2 10*3/uL (ref 4.0–11.0)

## 2022-05-14 LAB — LIPID PANEL
Cholesterol: 189 mg/dL (ref 75–199)
Coronary Heart Disease Risk: 3.86
HDL: 49 mg/dL (ref 40–55)
LDL Calculated: 123 mg/dL
Triglycerides: 84 mg/dL (ref 10–150)
VLDL: 17 (ref 0–40)

## 2022-05-14 LAB — COMPREHENSIVE METABOLIC PANEL
ALT: 21 U/L (ref 0–55)
AST (SGOT): 17 U/L (ref 10–42)
Albumin/Globulin Ratio: 1.41 Ratio (ref 0.80–2.00)
Albumin: 4.1 gm/dL (ref 3.5–5.0)
Alkaline Phosphatase: 61 U/L (ref 40–145)
Anion Gap: 12.1 mMol/L (ref 7.0–18.0)
BUN / Creatinine Ratio: 17.6 Ratio (ref 10.0–30.0)
BUN: 15 mg/dL (ref 7–22)
Bilirubin, Total: 0.3 mg/dL (ref 0.1–1.2)
CO2: 25 mMol/L (ref 20–30)
Calcium: 9.8 mg/dL (ref 8.5–10.5)
Chloride: 105 mMol/L (ref 98–110)
Creatinine: 0.85 mg/dL (ref 0.80–1.30)
EGFR: 101 mL/min/{1.73_m2} (ref 60–150)
Globulin: 2.9 gm/dL (ref 2.0–4.0)
Glucose: 167 mg/dL — ABNORMAL HIGH (ref 71–99)
Osmolality Calculated: 280 mOsm/kg (ref 275–300)
Potassium: 4.1 mMol/L (ref 3.5–5.3)
Protein, Total: 7 gm/dL (ref 6.0–8.3)
Sodium: 138 mMol/L (ref 136–147)

## 2022-05-14 LAB — HEMOGLOBIN A1C
Estimated Average Glucose: 128 mg/dL
Hgb A1C, %: 6.1 %

## 2022-05-14 LAB — THYROID STIMULATING HORMONE (TSH) WITH REFLEX TO FREE T4: TSH: 0.93 u[IU]/mL (ref 0.40–4.20)

## 2022-05-24 ENCOUNTER — Encounter (INDEPENDENT_AMBULATORY_CARE_PROVIDER_SITE_OTHER): Payer: Self-pay

## 2024-04-28 DEATH — deceased
